# Patient Record
Sex: Male | Born: 1945 | Race: White | Hispanic: No | Marital: Single
Health system: Southern US, Community
[De-identification: ages and names within clinical notes are randomized; demographics above are authoritative.]

---

## 2007-01-10 ENCOUNTER — Ambulatory Visit: Payer: Self-pay

## 2009-08-22 ENCOUNTER — Inpatient Hospital Stay: Payer: Self-pay | Admitting: Student

## 2011-04-15 ENCOUNTER — Inpatient Hospital Stay: Payer: Self-pay | Admitting: Internal Medicine

## 2011-04-15 LAB — ETHANOL: Ethanol %: <0.003 % (ref 0.000–0.080)

## 2011-04-15 LAB — COMPREHENSIVE METABOLIC PANEL
Albumin: 3.3 g/dL — ABNORMAL LOW (ref 3.4–5.0)
Alkaline Phosphatase: 65 U/L (ref 50–136)
Anion Gap: 10 (ref 7–16)
BUN: 4 mg/dL — ABNORMAL LOW (ref 7–18)
Bilirubin,Total: 2.1 mg/dL — ABNORMAL HIGH (ref 0.2–1.0)
Calcium, Total: 8.8 mg/dL (ref 8.5–10.1)
Co2: 34 mmol/L — ABNORMAL HIGH (ref 21–32)
EGFR (African American): >60
Glucose: 122 mg/dL — ABNORMAL HIGH (ref 65–99)
Osmolality: 268 (ref 275–301)
SGOT(AST): 46 U/L — ABNORMAL HIGH (ref 15–37)
Sodium: 135 mmol/L — ABNORMAL LOW (ref 136–145)

## 2011-04-15 LAB — CBC
HCT: 41.8 % (ref 40.0–52.0)
MCH: 36.1 pg — ABNORMAL HIGH (ref 26.0–34.0)
MCHC: 34.8 g/dL (ref 32.0–36.0)
MCV: 104 fL — ABNORMAL HIGH (ref 80–100)
Platelet: 91 10*3/uL — ABNORMAL LOW (ref 150–440)
RDW: 13.4 % (ref 11.5–14.5)

## 2011-04-15 LAB — FOLATE: Folic Acid: 43.6 ng/mL (ref 3.1–100.0)

## 2011-04-15 LAB — MAGNESIUM: Magnesium: 0.9 mg/dL — ABNORMAL LOW

## 2011-04-15 LAB — PROTIME-INR
INR: 1.1
Prothrombin Time: 14.6 secs (ref 11.5–14.7)

## 2011-04-16 LAB — COMPREHENSIVE METABOLIC PANEL
Albumin: 3.3 g/dL — ABNORMAL LOW (ref 3.4–5.0)
Alkaline Phosphatase: 61 U/L (ref 50–136)
Anion Gap: 13 (ref 7–16)
BUN: 8 mg/dL (ref 7–18)
Bilirubin,Total: 1.5 mg/dL — ABNORMAL HIGH (ref 0.2–1.0)
Calcium, Total: 8.9 mg/dL (ref 8.5–10.1)
Creatinine: 0.75 mg/dL (ref 0.60–1.30)
Glucose: 142 mg/dL — ABNORMAL HIGH (ref 65–99)
Osmolality: 269 (ref 275–301)
Potassium: 3.7 mmol/L (ref 3.5–5.1)
SGOT(AST): 39 U/L — ABNORMAL HIGH (ref 15–37)
Sodium: 134 mmol/L — ABNORMAL LOW (ref 136–145)
Total Protein: 7.2 g/dL (ref 6.4–8.2)

## 2011-04-16 LAB — CBC WITH DIFFERENTIAL/PLATELET
Basophil #: 0 10*3/uL (ref 0.0–0.1)
Eosinophil #: 0 10*3/uL (ref 0.0–0.7)
HCT: 44.1 % (ref 40.0–52.0)
HGB: 15 g/dL (ref 13.0–18.0)
Lymphocyte #: 0.5 10*3/uL — ABNORMAL LOW (ref 1.0–3.6)
Lymphocyte %: 12.1 %
MCV: 105 fL — ABNORMAL HIGH (ref 80–100)
Monocyte %: 3.6 %
Platelet: 87 10*3/uL — ABNORMAL LOW (ref 150–440)
RBC: 4.21 10*6/uL — ABNORMAL LOW (ref 4.40–5.90)
RDW: 14.3 % (ref 11.5–14.5)
WBC: 3.9 10*3/uL (ref 3.8–10.6)

## 2011-04-18 LAB — EXPECTORATED SPUTUM ASSESSMENT W GRAM STAIN, RFLX TO RESP C

## 2011-06-06 ENCOUNTER — Inpatient Hospital Stay: Payer: Self-pay | Admitting: Internal Medicine

## 2011-06-06 LAB — COMPREHENSIVE METABOLIC PANEL
Albumin: 4.2 g/dL (ref 3.4–5.0)
Anion Gap: 14 (ref 7–16)
Calcium, Total: 9.5 mg/dL (ref 8.5–10.1)
Chloride: 92 mmol/L — ABNORMAL LOW (ref 98–107)
Co2: 30 mmol/L (ref 21–32)
EGFR (African American): >60
Glucose: 102 mg/dL — ABNORMAL HIGH (ref 65–99)
Osmolality: 270 (ref 275–301)
Potassium: 3.7 mmol/L (ref 3.5–5.1)
SGOT(AST): 48 U/L — ABNORMAL HIGH (ref 15–37)
SGPT (ALT): 26 U/L
Sodium: 136 mmol/L (ref 136–145)

## 2011-06-06 LAB — PROTIME-INR: INR: 1.2

## 2011-06-06 LAB — CBC
HCT: 44.2 % (ref 40.0–52.0)
MCH: 35.8 pg — ABNORMAL HIGH (ref 26.0–34.0)
Platelet: 100 10*3/uL — ABNORMAL LOW (ref 150–440)
RDW: 14.9 % — ABNORMAL HIGH (ref 11.5–14.5)
WBC: 4.7 10*3/uL (ref 3.8–10.6)

## 2011-06-06 LAB — CK TOTAL AND CKMB (NOT AT ARMC): CK, Total: 44 U/L (ref 35–232)

## 2011-06-07 LAB — CK TOTAL AND CKMB (NOT AT ARMC)
CK, Total: 34 U/L — ABNORMAL LOW (ref 35–232)
CK, Total: 31 U/L — ABNORMAL LOW (ref 35–232)
CK-MB: 1.2 ng/mL (ref 0.5–3.6)
CK-MB: 1.1 ng/mL (ref 0.5–3.6)

## 2011-06-07 LAB — COMPREHENSIVE METABOLIC PANEL
Anion Gap: 15 (ref 7–16)
BUN: 9 mg/dL (ref 7–18)
Bilirubin,Total: 1.8 mg/dL — ABNORMAL HIGH (ref 0.2–1.0)
Calcium, Total: 9 mg/dL (ref 8.5–10.1)
Chloride: 92 mmol/L — ABNORMAL LOW (ref 98–107)
Co2: 27 mmol/L (ref 21–32)
Creatinine: 0.75 mg/dL (ref 0.60–1.30)
EGFR (African American): >60
EGFR (Non-African Amer.): >60
SGPT (ALT): 22 U/L
Total Protein: 7.4 g/dL (ref 6.4–8.2)

## 2011-06-07 LAB — CBC WITH DIFFERENTIAL/PLATELET
Basophil %: 0.5 %
Eosinophil #: 0 10*3/uL (ref 0.0–0.7)
Eosinophil %: 0.3 %
HCT: 42.4 % (ref 40.0–52.0)
HGB: 14.6 g/dL (ref 13.0–18.0)
Lymphocyte #: 0.7 10*3/uL — ABNORMAL LOW (ref 1.0–3.6)
Lymphocyte %: 33 %
MCHC: 34.4 g/dL (ref 32.0–36.0)
Monocyte #: 0 10*3/uL (ref 0.0–0.7)
Monocyte %: 1.4 %
Neutrophil #: 1.3 10*3/uL — ABNORMAL LOW (ref 1.4–6.5)
Neutrophil %: 64.8 %
RBC: 4.07 10*6/uL — ABNORMAL LOW (ref 4.40–5.90)
WBC: 2.1 10*3/uL — ABNORMAL LOW (ref 3.8–10.6)

## 2011-06-07 LAB — TROPONIN I: Troponin-I: <0.02 ng/mL

## 2011-06-08 LAB — CBC WITH DIFFERENTIAL/PLATELET
Basophil %: 0.1 %
Eosinophil #: 0 10*3/uL (ref 0.0–0.7)
HGB: 13.9 g/dL (ref 13.0–18.0)
Lymphocyte %: 8.8 %
MCH: 35.6 pg — ABNORMAL HIGH (ref 26.0–34.0)
MCHC: 34 g/dL (ref 32.0–36.0)
MCV: 105 fL — ABNORMAL HIGH (ref 80–100)
Monocyte #: 0.2 10*3/uL (ref 0.0–0.7)
Neutrophil #: 4.6 10*3/uL (ref 1.4–6.5)
Platelet: 88 10*3/uL — ABNORMAL LOW (ref 150–440)
RDW: 14.5 % (ref 11.5–14.5)

## 2011-06-08 LAB — URINALYSIS, COMPLETE
Bacteria: NONE SEEN
Bilirubin,UR: NEGATIVE
Blood: NEGATIVE
Glucose,UR: NEGATIVE mg/dL (ref 0–75)
Specific Gravity: 1.018 (ref 1.003–1.030)
Squamous Epithelial: <1

## 2011-06-10 LAB — URINE CULTURE

## 2013-10-03 ENCOUNTER — Inpatient Hospital Stay: Payer: Self-pay | Admitting: Internal Medicine

## 2013-10-03 LAB — CBC
HCT: 36.1 % — AB (ref 40.0–52.0)
HGB: 12.2 g/dL — ABNORMAL LOW (ref 13.0–18.0)
MCH: 34.5 pg — AB (ref 26.0–34.0)
MCHC: 33.8 g/dL (ref 32.0–36.0)
MCV: 102 fL — ABNORMAL HIGH (ref 80–100)
PLATELETS: 193 10*3/uL (ref 150–440)
RBC: 3.54 10*6/uL — ABNORMAL LOW (ref 4.40–5.90)
RDW: 14.1 % (ref 11.5–14.5)
WBC: 9.1 10*3/uL (ref 3.8–10.6)

## 2013-10-03 LAB — COMPREHENSIVE METABOLIC PANEL
ALBUMIN: 2.4 g/dL — AB (ref 3.4–5.0)
Alkaline Phosphatase: 106 U/L
Anion Gap: 8 (ref 7–16)
BILIRUBIN TOTAL: 1.7 mg/dL — AB (ref 0.2–1.0)
BUN: 8 mg/dL (ref 7–18)
CHLORIDE: 95 mmol/L — AB (ref 98–107)
CREATININE: 0.71 mg/dL (ref 0.60–1.30)
Calcium, Total: 8.4 mg/dL — ABNORMAL LOW (ref 8.5–10.1)
Co2: 29 mmol/L (ref 21–32)
EGFR (African American): >60
EGFR (Non-African Amer.): >60
GLUCOSE: 86 mg/dL (ref 65–99)
Osmolality: 262 (ref 275–301)
Potassium: 3.5 mmol/L (ref 3.5–5.1)
SGOT(AST): 74 U/L — ABNORMAL HIGH (ref 15–37)
SGPT (ALT): 33 U/L (ref 12–78)
Sodium: 132 mmol/L — ABNORMAL LOW (ref 136–145)
Total Protein: 6.6 g/dL (ref 6.4–8.2)

## 2013-10-03 LAB — PRO B NATRIURETIC PEPTIDE: B-TYPE NATIURETIC PEPTID: 629 pg/mL — AB (ref 0–125)

## 2013-10-03 LAB — CK TOTAL AND CKMB (NOT AT ARMC)
CK, Total: 204 U/L
CK-MB: 1.9 ng/mL (ref 0.5–3.6)

## 2013-10-03 LAB — D-DIMER(ARMC): D-DIMER: 5463 ng/mL

## 2013-10-03 LAB — TROPONIN I: Troponin-I: <0.02 ng/mL

## 2013-10-04 LAB — BASIC METABOLIC PANEL
ANION GAP: 10 (ref 7–16)
BUN: 10 mg/dL (ref 7–18)
CALCIUM: 8.2 mg/dL — AB (ref 8.5–10.1)
CHLORIDE: 96 mmol/L — AB (ref 98–107)
CO2: 26 mmol/L (ref 21–32)
Creatinine: 0.7 mg/dL (ref 0.60–1.30)
Glucose: 118 mg/dL — ABNORMAL HIGH (ref 65–99)
Osmolality: 265 (ref 275–301)
POTASSIUM: 4.2 mmol/L (ref 3.5–5.1)
Sodium: 132 mmol/L — ABNORMAL LOW (ref 136–145)

## 2013-10-04 LAB — CBC WITH DIFFERENTIAL/PLATELET
BASOS ABS: 0 10*3/uL (ref 0.0–0.1)
Basophil %: 0.3 %
EOS ABS: 0 10*3/uL (ref 0.0–0.7)
Eosinophil %: 0 %
HCT: 34.5 % — ABNORMAL LOW (ref 40.0–52.0)
HGB: 11.6 g/dL — ABNORMAL LOW (ref 13.0–18.0)
LYMPHS ABS: 0.3 10*3/uL — AB (ref 1.0–3.6)
LYMPHS PCT: 5.3 %
MCH: 34.5 pg — ABNORMAL HIGH (ref 26.0–34.0)
MCHC: 33.8 g/dL (ref 32.0–36.0)
MCV: 102 fL — ABNORMAL HIGH (ref 80–100)
MONOS PCT: 3.4 %
Monocyte #: 0.2 x10 3/mm (ref 0.2–1.0)
NEUTROS ABS: 5.5 10*3/uL (ref 1.4–6.5)
NEUTROS PCT: 91 %
PLATELETS: 146 10*3/uL — AB (ref 150–440)
RBC: 3.38 10*6/uL — ABNORMAL LOW (ref 4.40–5.90)
RDW: 14 % (ref 11.5–14.5)
WBC: 6 10*3/uL (ref 3.8–10.6)

## 2013-10-04 LAB — HEMOGLOBIN
HGB: 11.1 g/dL — ABNORMAL LOW (ref 13.0–18.0)
HGB: 12.3 g/dL — AB (ref 13.0–18.0)

## 2013-10-04 LAB — MAGNESIUM: Magnesium: 1.2 mg/dL — ABNORMAL LOW

## 2013-10-04 LAB — OCCULT BLOOD X 1 CARD TO LAB, STOOL: Occult Blood, Feces: POSITIVE

## 2013-10-05 LAB — BASIC METABOLIC PANEL
Anion Gap: 5 — ABNORMAL LOW (ref 7–16)
BUN: 11 mg/dL (ref 7–18)
CALCIUM: 8.1 mg/dL — AB (ref 8.5–10.1)
CHLORIDE: 97 mmol/L — AB (ref 98–107)
CO2: 30 mmol/L (ref 21–32)
Creatinine: 0.88 mg/dL (ref 0.60–1.30)
GLUCOSE: 118 mg/dL — AB (ref 65–99)
OSMOLALITY: 265 (ref 275–301)
Potassium: 4.7 mmol/L (ref 3.5–5.1)
SODIUM: 132 mmol/L — AB (ref 136–145)

## 2013-10-05 LAB — CBC WITH DIFFERENTIAL/PLATELET
BASOS PCT: 0.8 %
Basophil #: 0 10*3/uL (ref 0.0–0.1)
EOS ABS: 0.1 10*3/uL (ref 0.0–0.7)
Eosinophil %: 2.8 %
HCT: 32.9 % — AB (ref 40.0–52.0)
HGB: 11.3 g/dL — ABNORMAL LOW (ref 13.0–18.0)
LYMPHS PCT: 15.1 %
Lymphocyte #: 0.7 10*3/uL — ABNORMAL LOW (ref 1.0–3.6)
MCH: 34.9 pg — AB (ref 26.0–34.0)
MCHC: 34.2 g/dL (ref 32.0–36.0)
MCV: 102 fL — ABNORMAL HIGH (ref 80–100)
MONO ABS: 0.3 x10 3/mm (ref 0.2–1.0)
Monocyte %: 8 %
Neutrophil #: 3.2 10*3/uL (ref 1.4–6.5)
Neutrophil %: 73.3 %
Platelet: 139 10*3/uL — ABNORMAL LOW (ref 150–440)
RBC: 3.22 10*6/uL — ABNORMAL LOW (ref 4.40–5.90)
RDW: 13.9 % (ref 11.5–14.5)
WBC: 4.3 10*3/uL (ref 3.8–10.6)

## 2013-10-05 LAB — VANCOMYCIN, TROUGH: Vancomycin, Trough: 20 ug/mL (ref 10–20)

## 2013-10-05 LAB — HEMOGLOBIN: HGB: 11.8 g/dL — ABNORMAL LOW (ref 13.0–18.0)

## 2013-10-06 LAB — HEMOGLOBIN: HGB: 10.7 g/dL — ABNORMAL LOW (ref 13.0–18.0)

## 2013-10-06 LAB — CREATININE, SERUM
Creatinine: 0.9 mg/dL (ref 0.60–1.30)
EGFR (African American): >60

## 2013-10-06 LAB — EXPECTORATED SPUTUM ASSESSMENT W REFEX TO RESP CULTURE

## 2013-10-07 LAB — BUN: BUN: 5 mg/dL — ABNORMAL LOW (ref 7–18)

## 2013-10-07 LAB — HEMOGLOBIN: HGB: 11.5 g/dL — ABNORMAL LOW (ref 13.0–18.0)

## 2013-10-08 LAB — CULTURE, BLOOD (SINGLE)

## 2014-03-17 LAB — COMPREHENSIVE METABOLIC PANEL
Albumin: 4 g/dL (ref 3.4–5.0)
Alkaline Phosphatase: 60 U/L
Anion Gap: 5 — ABNORMAL LOW (ref 7–16)
BUN: 9 mg/dL (ref 7–18)
Bilirubin,Total: 2 mg/dL — ABNORMAL HIGH (ref 0.2–1.0)
Calcium, Total: 9.6 mg/dL (ref 8.5–10.1)
Chloride: 96 mmol/L — ABNORMAL LOW (ref 98–107)
Co2: 31 mmol/L (ref 21–32)
Creatinine: 0.95 mg/dL (ref 0.60–1.30)
EGFR (Non-African Amer.): >60
GLUCOSE: 111 mg/dL — AB (ref 65–99)
Osmolality: 264 (ref 275–301)
Potassium: 4.5 mmol/L (ref 3.5–5.1)
SGOT(AST): 33 U/L (ref 15–37)
SGPT (ALT): 22 U/L
Sodium: 132 mmol/L — ABNORMAL LOW (ref 136–145)
Total Protein: 7.6 g/dL (ref 6.4–8.2)

## 2014-03-17 LAB — CBC
HCT: 43.5 % (ref 40.0–52.0)
HGB: 15 g/dL (ref 13.0–18.0)
MCH: 36.2 pg — AB (ref 26.0–34.0)
MCHC: 34.6 g/dL (ref 32.0–36.0)
MCV: 105 fL — AB (ref 80–100)
Platelet: 108 10*3/uL — ABNORMAL LOW (ref 150–440)
RBC: 4.15 10*6/uL — ABNORMAL LOW (ref 4.40–5.90)
RDW: 14.7 % — ABNORMAL HIGH (ref 11.5–14.5)
WBC: 9.5 10*3/uL (ref 3.8–10.6)

## 2014-03-17 LAB — TROPONIN I

## 2014-03-17 LAB — CK TOTAL AND CKMB (NOT AT ARMC)
CK, TOTAL: 50 U/L (ref 39–308)
CK-MB: 1.2 ng/mL (ref 0.5–3.6)

## 2014-03-18 ENCOUNTER — Inpatient Hospital Stay: Payer: Self-pay | Admitting: Internal Medicine

## 2014-03-18 LAB — CBC WITH DIFFERENTIAL/PLATELET
BASOS ABS: 0 10*3/uL (ref 0.0–0.1)
Basophil %: 0.2 %
EOS ABS: 0 10*3/uL (ref 0.0–0.7)
Eosinophil %: 0.3 %
HCT: 36.8 % — ABNORMAL LOW (ref 40.0–52.0)
HGB: 12.9 g/dL — AB (ref 13.0–18.0)
LYMPHS PCT: 14.9 %
Lymphocyte #: 0.4 10*3/uL — ABNORMAL LOW (ref 1.0–3.6)
MCH: 37 pg — AB (ref 26.0–34.0)
MCHC: 35.2 g/dL (ref 32.0–36.0)
MCV: 105 fL — AB (ref 80–100)
MONOS PCT: 1.1 %
Monocyte #: 0 x10 3/mm — ABNORMAL LOW (ref 0.2–1.0)
NEUTROS ABS: 2 10*3/uL (ref 1.4–6.5)
Neutrophil %: 83.5 %
Platelet: 78 10*3/uL — ABNORMAL LOW (ref 150–440)
RBC: 3.49 10*6/uL — ABNORMAL LOW (ref 4.40–5.90)
RDW: 14.8 % — AB (ref 11.5–14.5)
WBC: 2.4 10*3/uL — ABNORMAL LOW (ref 3.8–10.6)

## 2014-03-18 LAB — BASIC METABOLIC PANEL
Anion Gap: 8 (ref 7–16)
BUN: 13 mg/dL (ref 7–18)
CHLORIDE: 98 mmol/L (ref 98–107)
Calcium, Total: 9.1 mg/dL (ref 8.5–10.1)
Co2: 28 mmol/L (ref 21–32)
Creatinine: 0.91 mg/dL (ref 0.60–1.30)
EGFR (African American): >60
EGFR (Non-African Amer.): >60
GLUCOSE: 177 mg/dL — AB (ref 65–99)
Osmolality: 273 (ref 275–301)
Potassium: 4.5 mmol/L (ref 3.5–5.1)
Sodium: 134 mmol/L — ABNORMAL LOW (ref 136–145)

## 2014-03-19 LAB — BASIC METABOLIC PANEL
ANION GAP: 3 — AB (ref 7–16)
BUN: 19 mg/dL — AB (ref 7–18)
CREATININE: 0.77 mg/dL (ref 0.60–1.30)
Calcium, Total: 8.2 mg/dL — ABNORMAL LOW (ref 8.5–10.1)
Chloride: 104 mmol/L (ref 98–107)
Co2: 28 mmol/L (ref 21–32)
EGFR (Non-African Amer.): >60
GLUCOSE: 146 mg/dL — AB (ref 65–99)
Osmolality: 275 (ref 275–301)
POTASSIUM: 4.4 mmol/L (ref 3.5–5.1)
Sodium: 135 mmol/L — ABNORMAL LOW (ref 136–145)

## 2014-03-19 LAB — CBC WITH DIFFERENTIAL/PLATELET
BASOS ABS: 0 10*3/uL (ref 0.0–0.1)
BASOS PCT: 0.1 %
EOS ABS: 0 10*3/uL (ref 0.0–0.7)
Eosinophil %: 0 %
HCT: 34.7 % — ABNORMAL LOW (ref 40.0–52.0)
HGB: 12.1 g/dL — ABNORMAL LOW (ref 13.0–18.0)
Lymphocyte #: 0.3 10*3/uL — ABNORMAL LOW (ref 1.0–3.6)
Lymphocyte %: 3.9 %
MCH: 36.8 pg — ABNORMAL HIGH (ref 26.0–34.0)
MCHC: 34.8 g/dL (ref 32.0–36.0)
MCV: 106 fL — ABNORMAL HIGH (ref 80–100)
MONO ABS: 0.2 x10 3/mm (ref 0.2–1.0)
MONOS PCT: 2.5 %
Neutrophil #: 7.5 10*3/uL — ABNORMAL HIGH (ref 1.4–6.5)
Neutrophil %: 93.5 %
Platelet: 74 10*3/uL — ABNORMAL LOW (ref 150–440)
RBC: 3.29 10*6/uL — ABNORMAL LOW (ref 4.40–5.90)
RDW: 14.5 % (ref 11.5–14.5)
WBC: 8 10*3/uL (ref 3.8–10.6)

## 2014-05-19 DEATH — deceased

## 2014-07-11 NOTE — Consult Note (Signed)
Chief Complaint:  Subjective/Chief Complaint seen for possible GI bleeding.  no further emesis, no abdominalpain.  bm brown.  tolerating regular diet po.   VITAL SIGNS/ANCILLARY NOTES: **Vital Signs.:   20-Jul-15 14:20  Vital Signs Type Routine  Temperature Temperature (F) 98.3  Celsius 36.8  Pulse Pulse 80  Respirations Respirations 20  Systolic BP Systolic BP 295  Diastolic BP (mmHg) Diastolic BP (mmHg) 61  Mean BP 75  Pulse Ox % Pulse Ox % 96  Pulse Ox Activity Level  At rest  Oxygen Delivery Room Air/ 21 %   Brief Assessment:  Cardiac Regular   Respiratory clear BS   Gastrointestinal details normal Nontender  protuberant, positive ascites, bs positive,   Lab Results: Routine Chem:  20-Jul-15 06:53   Creatinine (comp) 0.90  eGFR (African American) >60  eGFR (Non-African American) >60 (eGFR values <1m/min/1.73 m2 may be an indication of chronic kidney disease (CKD). Calculated eGFR is useful in patients with stable renal function. The eGFR calculation will not be reliable in acutely ill patients when serum creatinine is changing rapidly. It is not useful in  patients on dialysis. The eGFR calculation may not be applicable to patients at the low and high extremes of body sizes, pregnant women, and vegetarians.)  Routine Hem:  17-Jul-15 14:47   Hemoglobin (CBC)  12.2  18-Jul-15 05:57   Hemoglobin (CBC)  11.6    09:58   Hemoglobin (CBC)  11.1 (Result(s) reported on 04 Oct 2013 at 10:15AM.)    14:46   Hemoglobin (CBC)  12.3 (Result(s) reported on 04 Oct 2013 at 03:03PM.)  19-Jul-15 08:05   Hemoglobin (CBC)  11.3    17:37   Hemoglobin (CBC)  11.8 (Result(s) reported on 05 Oct 2013 at 06:20PM.)  20-Jul-15 06:53   Hemoglobin (CBC)  10.7 (Result(s) reported on 06 Oct 2013 at 07:16AM.)   Assessment/Plan:  Assessment/Plan:  Assessment 1) emesis, coffeeground-not recurrent over 48 hours.  hemodynamically stable.  likelly related to small amounts of blood  swallowed.from oropharyngeal surgical sites.  2) passed swallowing study, now on soft diet, tolerating.  3) cirrhosis, ongoing etoh abuse.   Plan 1) continue daily ppi, GI o/p fu with primary md.  I will happy to see if needed/   Electronic Signatures: SLoistine Simas(MD)  (Signed 20-Jul-15 18:15)  Authored: Chief Complaint, VITAL SIGNS/ANCILLARY NOTES, Brief Assessment, Lab Results, Assessment/Plan   Last Updated: 20-Jul-15 18:15 by SLoistine Simas(MD)

## 2014-07-11 NOTE — H&P (Signed)
PATIENT NAME:  Randa SpikeRIGGS, Kaiea MR#:  [id number] DATE OF BIRTH:  [birth year]  DATE OF ADMISSION:  10/03/2013  PRIMARY CARE PHYSICIAN: [facility]   CHIEF COMPLAINT: Shortness of breath.   HISTORY OF PRESENT ILLNESS: This is a 69 year old man. History is difficult to obtain from him secondary to him not having his hearing aids in.  He just states that he feels bad and has breathing problem. Feels terrible. He did give me the phone number of Alesia MorinStephanie Sprinkle, [phone].  As per her, he [facility] for a tongue cancer, roof of the mouth and lymph node dissection in the neck.  He was on the ventilator for 2 days.  Yesterday, he was discharged home. Refused to go to rehabilitation.  He got home last night at 5: 30 p.m. His pulse oximetry this morning was 87%, and in the afternoon, 80%. He is complaining of shortness of breath. In the ER, he was found to have pneumonia and acute respiratory failure with a low pulse oximetry. Hospitalist services were contacted for further evaluation. Again, history from patient difficult to obtain secondary to hearing loss.   PAST MEDICAL HISTORY: Chronic obstructive pulmonary disease, cirrhosis of the liver, alcohol abuse, and depression.   PAST SURGICAL HISTORY: Neck surgery.  Still has staples in.   ALLERGIES: NO KNOWN DRUG ALLERGIES.   MEDICATIONS: As per prescription writer include DuoNeb nebulizer solution every 4 hours, Celexa 20 mg daily, ferrous sulfate 325 mg twice a day, folic acid 1 mg daily, furosemide 20 mg half tablet daily, magnesium oxide 400 mg twice a day, multivitamin with minerals daily, Naprosyn 500 mg twice a day, omeprazole 20 mg twice a day, propranolol 5 mg daily, Proventil 2 puffs every 6 hours, Spiriva 1 inhalation daily, Bactrim DS 1 tablet twice a day, theophylline SR 300 mg daily, and thiamine 100 mg daily.   SOCIAL HISTORY: He drinks alcohol, but has not drank last night, had 2 beers; but prior to the surgery and in the  hospital, was not drinking.  Smokes 2-3 packs per day. No drug use. Lives with a friend.   FAMILY HISTORY: Unable to obtain secondary to difficulty history.    REVIEW OF SYSTEMS: Unable to obtain secondary to hearing loss.   PHYSICAL EXAMINATION:  VITAL SIGNS: Temperature 99, pulse 98, respirations 24, blood pressure 146/71, pulse oximetry 92% on oxygen. When he presented to the ER, his pulse oximetry is 95% on 3 liters.  EYES: Conjunctivae and lids normal. Pupils equal, round, and reactive to light. Unable to test extraocular muscles.  EARS, NOSE, MOUTH AND THROAT: Status post surgery on partial tongue and back of the throat.  Some food residual in the back of the mouth.  NECK: No JVD. No bruits. No lymphadenopathy. Staples still in the left neck.  CARDIOVASCULAR: S1, S2 normal. No gallops, rubs, or murmurs heard. Carotid upstroke 2+ bilaterally. No bruits. Dorsalis pedis pulses 1+ bilaterally. Trace edema of the lower extremity.  LUNGS: Coarse breath sounds throughout. No wheeze or rhonchi heard.  ABDOMEN: Soft and nontender. No organomegaly/splenomegaly. Normoactive bowel sounds.  Umbilical hernia.  EXTREMITIES: Trace edema. No clubbing. No cyanosis.  SKIN: No ulcers or lesions.  NEUROLOGIC: Cranial nerves II-XII grossly intact.  Patient difficult to follow instructions secondary to the hearing loss though.  Moving all extremities on his own. Reflexes 1+ bilateral lower extremity.  PSYCHIATRIC: Difficult to test secondary to hearing loss.   LABORATORY AND RADIOLOGICAL DATA: D-dimer 5463. Troponin negative. Glucose  86, BUN 8, creatinine 0.71, sodium 132, potassium 3.5, chloride 95, CO2 28, calcium 8.4, total bilirubin 1.7, alkaline phosphatase 106.  ALT 33, AST 74. White blood cell count 7.9, hemoglobin and hematocrit 12.2 and 36.1, platelet count 193,000. BNP 627.   Chest x-ray showed right lower lobe pneumonia. Venous pH 7.47. CT scan of the chest showed no evidence of pulmonary embolism,  multifocal peribronchial nodular and some solid opacity in the right middle and right upper lobe, could be infectious, but could be intermediate for malignancy. Cirrhosis seen.   ASSESSMENT AND PLAN:  1.  Acute respiratory failure. Pulse oximetry low on room air, in the 80s, requiring oxygen supplementation to maintain adequate saturations.   2.  Likely aspiration pneumonia with recent tongue and throat surgery. We will get aggressive with antibiotics since he was recently in the hospital with vancomycin, Zosyn and Levaquin, and continue to monitor.   3.  Chronic obstructive pulmonary disease.  I am going to hold off on steroids at this point. Continue inhalers.   4.  Cirrhosis on low-dose propranolol.   5.  Alcohol abuse. No need for CIWA protocol at this point since he was recently in the hospital.   6.  Depression. Continue Celexa.   7.  Hypokalemia: Will replace that IV.   8.  I will get a swallow evaluation with a recent surgery to see how well he swallows.  Put on dysphagia diet at this point in time.  TIME SPENT ON ADMISSION: 55 minutes.   CODE STATUS: The patient is a DO NOT RESUSCITATE.     ____________________________ Herschell Dimes. Renae Gloss, MD rjw:ts D: 10/03/2013 18:56:34 ET T: 10/03/2013 19:37:30 ET JOB#: [id number]  cc: Herschell Dimes. Renae Gloss, MD, <Dictator> [facility] Jeanella Anton MD ELECTRONICALLY SIGNED 10/03/2013 21:40

## 2014-07-11 NOTE — Consult Note (Signed)
Chief Complaint:  Subjective/Chief Complaint seen for hematemesis, , melena. no repeat bm or emesis.  no abdominalp pain, main c/o is wants to eat.   VITAL SIGNS/ANCILLARY NOTES: **Vital Signs.:   19-Jul-15 13:29  Vital Signs Type Q 4hr  Temperature Temperature (F) 98.2  Celsius 36.7  Temperature Source oral  Pulse Pulse 88  Respirations Respirations 20  Systolic BP Systolic BP 117  Diastolic BP (mmHg) Diastolic BP (mmHg) 65  Mean BP 82  Pulse Ox % Pulse Ox % 92  Pulse Ox Activity Level  At rest  Oxygen Delivery 2L; Nasal Cannula  *Intake and Output.:   Daily 19-Jul-15 07:00  Emesis ml     Out:  240   Brief Assessment:  Cardiac Regular   Respiratory coarse airway noise.   Gastrointestinal details normal Soft  Nontender  Bowel sounds normal  protuberant/ ascites   Lab Results: Routine Chem:  17-Jul-15 14:47   BUN 8  18-Jul-15 05:57   BUN 10  19-Jul-15 08:05   BUN 11  Routine Hem:  17-Jul-15 14:47   Hemoglobin (CBC)  12.2  18-Jul-15 05:57   Hemoglobin (CBC)  11.6    09:58   Hemoglobin (CBC)  11.1 (Result(s) reported on 04 Oct 2013 at 10:15AM.)    14:46   Hemoglobin (CBC)  12.3 (Result(s) reported on 04 Oct 2013 at 03:03PM.)  19-Jul-15 08:05   Hemoglobin (CBC)  11.3   Assessment/Plan:  Assessment/Plan:  Assessment 1) coffeeground emesis, heme positiv stool.  no recurrent emesis or bm, no evidence of ongoing bleeding.  likely from recent extensive oropharyngeal surgery [facility].  EGD could be problematic if needed in light of having to pass scope beyond multiple recent oropharyngeal surgical wounds.  2) history of cirrhosis in the setting of ongoing etoh abuse.   Plan continue ppi, agree with d/c octreotide.  Swallowing evaluation tomorrow, to start diet thereafter.   Electronic Signatures: Barnetta ChapelSkulskie, Keelyn Fjelstad (MD)  (Signed 19-Jul-15 15:54)  Authored: Chief Complaint, VITAL SIGNS/ANCILLARY NOTES, Brief Assessment, Lab Results,  Assessment/Plan   Last Updated: 19-Jul-15 15:54 by Barnetta ChapelSkulskie, Harly Pipkins (MD)

## 2014-07-11 NOTE — Consult Note (Signed)
PATIENT NAME:  Gary Cortez, NILE MR#:  [id number] DATE OF BIRTH:  12/08/1945  DATE OF CONSULTATION:  10/04/2013  REFERRING PHYSICIAN:   CONSULTING PHYSICIAN:  Christena Deem, MD  A patient of Dr. Elpidio Anis.   REASON FOR CONSULTATION: Hematemesis in the setting of known history of cirrhosis.   HISTORY OF PRESENT ILLNESS: [name] is a 69 year old Caucasian male, who was admitted to the hospital at [facility] yesterday after a recent discharge from the [place] in, apparently, [place]. He apparently underwent a resection of a tongue cancer on 09/23/2013. He stated he felt he was not getting good care and then went apparently AMA. He was admitted here at [facility] with shortness of breath and pneumonia with acute respiratory failure. Earlier today, he had an episode of a small amount of coffee-ground emesis, as well as passing a dark stool. He has been hemodynamically stable. He states that before his surgery, he had no problems with nausea or vomiting. He had no blood in his stool or black bowel movements. Currently, he states he has some minimal nausea, emesis x 1 today. He usually has a bowel movement daily. He does have problems with heartburn occasionally. He takes no medications for this. There is no dysphagia. He denies any abdominal pain. His abdomen is distended with ascites. He states that he has known he had cirrhosis for many years. He has had an EGD and colonoscopy in the past at the [facility]. He denies any previous history of peptic ulcer disease. He denies any previous GI bleeding.   LIVER HISTORY: The patient continues to use alcohol, drinking about twelve 12-ounce beers daily. He has done this for many years. He does have many tattoos, the first placed about 50 years ago. The last one about 2 years ago. He does have a history of military experience, being in the [employer] for about 4-1/2 years, serving in [place] between the years of 1965 and 1966. No foreign travel otherwise. He has never used IV drugs  or intranasal cocaine. He states he has never had a blood transfusion. There is no family history of liver disease. He denies any previous history of hepatitis or episodes of jaundice.   PAST MEDICAL HISTORY:  1. COPD. 2. Cirrhosis of the liver.  3. Ongoing alcohol abuse.  4. Depression.  5. Recent diagnosis of head and neck cancer with surgery a little over a week ago with neck dissection, as well as debridement of the soft palate and tongue.   ALLERGIES: There are no known drug allergies.   OUTPATIENT MEDICATIONS: Include albuterol/ipratropium, citalopram 40 mg 1/2 tablet daily, ferrous sulfate 325 mg twice a day, folic acid 1 mg once a day, Lasix 20 mg 1/2 tablet daily, magnesium oxide 40 mg tablet twice a day, multiple vitamin with minerals 1 tablet once a day, naproxen 500 mg twice a day, omeprazole 20 mg twice a day, propranolol 10 mg 1/2 tablet once a day, Proventil HFA, Spiriva 18 mcg inhalation capsule once daily, spironolactone 25 mg once a day, Bactrim 1 tablet twice a day, theophylline 300 mg once a day, thiamine 100 mg once a day.   REVIEW OF SYSTEMS: Per admission history and physical, was unable to obtain due to hearing loss and lack of having his hearing aids.   PHYSICAL EXAMINATION:  VITAL SIGNS: Temperature is 98.6, pulse 93, respirations 21, blood pressure 121/65, pulse oximetry 97%.  GENERAL: He is an elderly-appearing Caucasian male in no acute distress. Some difficulty hearing.  HEENT: Normocephalic, atraumatic.  Eyes are anicteric. Oropharynx shows recent debridement at the left side of the tongue extending toward the base. There are bruises below the tongue. There is debridement on the soft palate on the right side. The tongue was left side. He is edentulous.  HEART: Regular rate and rhythm.  LUNGS: Clear.  ABDOMEN: Distended/protuberant. There is some amount of ascites. He is nontender to palpation. There is a small umbilical hernia that is nontender. Bowel sounds are  positive, normoactive.  RECTAL: Anorectal exam deferred.  EXTREMITIES: No clubbing, cyanosis, or edema.  NEUROLOGICAL: Cranial nerves II through XII grossly intact.   LABORATORY DATA: On admission to the hospital on the 17th, he had a BNP of 629, glucose 86, BUN 8, creatinine 0.71, sodium 132, potassium 3.5, chloride 95, bicarbonate 29, calcium 8.4. Hepatic profile shows a total protein of 6.6, albumin 2.4, total bilirubin 1.7, alkaline phosphatase 106, AST 74, ALT 33, CK total was 204, CPK-MB was 1.9. Troponin I x1 less than 0.02. He had a CBC showing a white count of 9.1, hemoglobin and hematocrit of 12.2/36.1, platelet count 193,000. MCV 102.   He has had 3 hemoglobins done today as follows: At 0557, it was 11.6; at 0958, it was 11.1; and at 1446, it was 12.3. He has not received any transfusion. On admission to the hospital, he had a D-dimer of 5463.   He has had blood cultures x 2, no growth. He did have a positive Hemoccult.   He had a CT scan of the chest to rule out PE, yesterday. This showed no evidence of acute pulmonary embolus. There was a multifocal peribronchial nodular and subsolid opacity in the right middle lobe and right upper lobe. Some of these appeared infectious, possibly early pneumonia. Some of them are indeterminate for malignancy, particularly a 15 mm nodule in the upper lobe on the right. There is no mediastinal adenopathy. Small bilateral layering pleural effusions, cirrhosis and stigmata of portal venous hypertension including moderate ascites and varices.   He had a KUB today showing no evidence of bowel obstruction, ascites, and a nonspecific gas and a nondilated small bowel.   ASSESSMENT: Coffee-ground emesis and Hemoccult positive dark stool. The patient does have a history of long-term cirrhosis. He does show esophageal varices on CT scanning. He had 1 episode of coffee-ground emesis earlier today. This has not been repeated. He has been hemodynamically stable and his  serial hemoglobins over the course of the day have been stable, indeed increasing to above baseline on admission. Impression is that although this could indeed be a gastrointestinal bleed, this could also be part of differential diagnosis, including previous bleeding from the surgery sites in the posterior oropharynx, with the small amount of coffee-ground emesis, a clearance of the stomach from that problem. He has no previous history of peptic ulcer disease. He has been taking a proton pump inhibitor at home; however, he also has been taking nonsteroidal anti-inflammatory medications at home, as well. He has been on oral iron at home, which would also cause a dark stool.   RECOMMENDATIONS: The patient is relatively high risk for sedated procedure due to his ongoing pneumonia and multiple other medical issues. He has been hemodynamically stable and his hemoglobin has been stable, as well. Recommend serial hemoglobins, transfuse as needed. Agree with starting the octreotide, as well as proton pump inhibitor drips. If he shows evidence of further bleeding, would place him into the intensive care unit for closer followup. Would consider esophagogastroduodenoscopy, depending on clinical changes.  We will follow with you.    ____________________________ Christena Deem, MD mus:jr D: 10/04/2013 17:20:49 ET T: 10/04/2013 17:59:23 ET JOB#: [id number]  cc: Christena Deem, MD, <Dictator> Christena Deem MD ELECTRONICALLY SIGNED 10/16/2013 17:42 Christena Deem MD ELECTRONICALLY SIGNED 10/16/2013 17:44

## 2014-07-11 NOTE — Discharge Summary (Signed)
PATIENT NAME:  Gary Cortez, Gary Cortez MR#:  [id number] DATE OF BIRTH:  1945-04-21  DATE OF ADMISSION:  10/03/2013 DATE OF DISCHARGE:  10/07/2013  DISCHARGE DIAGNOSES:  1.  Acute respiratory failure.  2.  Aspiration pneumonia.  3.  Hematemesis, likely from bleeding from his surgical site.  4.  Anemia of chronic disease.  5.  Cirrhosis.  6.  Tongue cancer.  7.  Alcohol abuse.  8.  Malnutrition.   CONSULTANTS: Dr. Marva PandaSkulskie, Gastroenterology.   IMAGING STUDIES: Include a chest x-ray which showed right-sided infiltrates, both upper and lower lobes.   ADMITTING HISTORY AND PHYSICAL: Please see detailed H and P dictated by Dr. Renae GlossWieting. In brief, the patient is a 69 year old male patient with history of tongue cancer, recent neck lymph node dissection, presented to the hospital complaining of worsening shortness of breath. The patient was found to have oxygen saturation in the 80s on room air, admitted to the hospitalist service for aspiration pneumonia with infiltrates on the right side.   HOSPITAL COURSE:  1.  Aspiration pneumonia with acute respiratory failure. The patient was started on broad-spectrum antibiotics for concern for healthcare acquired pneumonia, also oxygen to keep his oxygen saturation over 92%. With cultures being negative for MRSA vancomycin was stopped initially, later he is being transitioned to Augmentin and Levaquin at time of discharge to finish course of antibiotics. The patient by the time of discharge is off oxygen, ambulated with saturations over 92% on room air. Cultures negative. Patient afebrile. White count has trended down to normal.  2.  Hematemesis, initially thought to be likely GI bleed, but possible reason is from his surgical site from recent neck dissection. The patient was on octreotide/Protonix drip which has been stopped. Hemoglobin has been fairly stable.  3.  [place]. [facility]  4.  Alcohol abuse. The patient counseled to quit alcohol.    DISCHARGE PHYSICAL EXAMINATION: Prior to discharge, the patient's lungs sound clear.  S1, S2 heard without any murmurs. No edema. Abdomen soft, nontender.   DISCHARGE MEDICATIONS:  1.  Propranolol 5 mg oral once a day.  2.  Omeprazole 20 mg oral 2 times a day.  3.  Multivitamin 1 tablet daily.  3.  Folic acid 1 mg daily.  4.  Ferrous sulfate 325 mg 2 times a day.  5.  Spironolactone 25 mg daily.  6.  Thiamine 100 mg daily.  7.  Lasix 10 mg oral once a day.  8.  Proventil HFA 2 inhalations every 6 hours as needed for wheezing.  9.  DuoNeb 3 mL inhaled every 4 hours as needed.  11. Theophylline 300 mg once a day.  12. Citalopram 20 mg daily.  13. Magnesium oxide 400 mg oral 2 times a day.  14. Augmentin 875 mg oral 2 times a day for 6 days.  15. Levaquin 250 mg daily for 6 days.   DISCHARGE INSTRUCTIONS: Low-sodium diet, activity as tolerated. Followup with primary care physician in 1-2 weeks.    ____________________________ Molinda BailiffSrikar R. Casidy Alberta, MD srs:lt D: 10/09/2013 14:16:53 ET T: 10/09/2013 14:41:30 ET JOB#: [id number]  cc: Wardell HeathSrikar R. Kathie Posa, MD, <Dictator> [facility] [place] Wardell HeathSRIKAR [place] Bunnie Lederman MD ELECTRONICALLY SIGNED 10/29/2013 15:44

## 2014-07-11 NOTE — Consult Note (Signed)
Brief Consult Note: Diagnosis: coffeeground emesis, dark stool, heme positive.   Patient was seen by consultant.   Consult note dictated.   Recommend further assessment or treatment.   Comments: Pleae see full GI consutl #[id number]. [facility] and p[facility].  He had a fairly moderate dissection for orophayrngeal cancer to include neck dissection, tongue and soft palate/posterior pharynx debridement.  He has had one episode of emesis earlier today and passage of a dark loose stool heme positive.  GI bleeding source may be suspected particularly in that he takes nsaids at home (he also takes PPI) adn h/o cirrhosis, no previous h/o ulcers, positive varices on ct.  He has passed no bright red blood.  He is stable on serial hgb over the course of the day, also hemodynamically stable.  Part of differential diagnosis includes intermittant bleeding from oropharyngeal surgical sites with swallowed blood and emesis of same.  He takes iron at home that would also cause dark stool with heme positive from swalowed blood as well.   Agree with starting octreotide and iv ppi.  NPO for now. Will need swallowing evaluation proir to starting diet for recommendations.   Continue close observations, transfuse as needed, serial hgb.   EGD as indicated clinically, will follow with you. If there is recurrent evidence of new bleeding, recommend transfer to CCU for management.  Electronic Signatures: Barnetta ChapelSkulskie, Nasira Janusz (MD)  (Signed 18-Jul-15 17:33)  Authored: Brief Consult Note   Last Updated: 18-Jul-15 17:33 by Barnetta ChapelSkulskie, Amarian Botero (MD)

## 2014-07-12 NOTE — H&P (Signed)
PATIENT NAME:  Gary Cortez, Gary Cortez MR#:  [id number] DATE OF BIRTH:  [birth year]  DATE OF ADMISSION:  06/06/2011  PRIMARY CARE PHYSICIAN: [facility]  HISTORY OF PRESENT ILLNESS: Patient is a 69 year old Caucasian male with past medical history significant for history of severe emphysema, history of ongoing tobacco abuse for the past 50 years, history of alcohol abuse presented to the hospital with complaints of shortness of breath. According to patient he started having on and off shortness of breath, worsening shortness of breath over the past five days. He has been coughing with yellowish and greenish phlegm. Denies any fevers or chills. He admits of wheezing as well as shortness of breath. His oxygen saturation in the Emergency Room was 84% and hospitalist services were contacted for admission. He was tried on BiPAP, however, he desaturated as was very anxious.   PAST MEDICAL HISTORY:  1. History of severe emphysema.   2. History of admission in 2011 at [facility] for acute on chronic respiratory failure due to chronic obstructive pulmonary disease exacerbation.  3. History of hearing loss, severe bilateral.  4. 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 5. History of alcohol abuse. 6. History of alcohol withdrawal in the past. 7. History of hypokalemia, hypomagnesemia. 8. History of continued alcohol ingestion. 9. History of alcoholic liver disease with ascites.   10. History of variceal bleeding in the past versus esophageal varices based on history on propranolol prescription.  11. History of alcohol dependence, drinking approximately 8 to 12 cans of beer daily according to medical records however, patient tells me that he drinks approximately 12 to 14 of 12-ounces of beers a day approximately. 12. Smokes as mentioned above.   MEDICATION LIST:  1. Thiamine 100 mg p.o. daily.  2. Iron sulfate 325 mg p.o. daily.  3. Bentyl as needed. 4. Lasix 20 mg p.o.  daily.  5. Multivitamins once daily.  6. Omeprazole 20 mg p.o. twice daily. 7. Albuterol nebulizers as needed. 8. Propranolol 10 mg twice daily. 9. Spironolactone 25 mg p.o. daily.  10. Folic acid 1 mg p.o. daily.   SOCIAL HISTORY: Patient lives at home by himself. Continues to smoke approximately 2 packs a day. Smoked since age of 53. Drinks approximately 12 to 14 of 12-ounce beers a day. No IV drug abuse. He was in [place], spent 14 months, now belongs to [place] or [place].    FAMILY HISTORY: Patient is not very sure about family history according to medical records.  REVIEW OF SYSTEMS: Not available as patient forgot his hearing aids and he is not hearing my questions well.  PHYSICAL EXAMINATION: VITAL SIGNS: On arrival to the hospital patient's vitals: Temperature 97, pulse 74, respiration rate 18, blood pressure 137/68, saturation 84% on room air.   GENERAL: This is a well-nourished Caucasian male lying sideways on the stretcher.   HEENT: Pupils are equal, reactive to light. Extraocular movements are intact. No icterus or conjunctivitis. Has very difficult hearing. No pharyngeal erythema. Mucosa is moist.   NECK: Neck did not reveal any masses. Supple, nontender. Thyroid not enlarged. No adenopathy. No JVD or carotid bruits bilaterally. Full range of motion.   LUNGS: Diminished breath sounds bilaterally and wheezing as well as labored inspirations and increased effort. Patient is in moderate respiratory distress especially whenever he moves around as well as coughs. No dullness to percussion. No rales. Few rhonchi were heard.   CARDIOVASCULAR: S1, S2 appreciated. No murmurs, gallops, rubs noted. Distant. PMI not lateralized. Chest  is nontender to palpation.   EXTREMITIES: 1+ pedal pulses. No lower extremity edema, calf tenderness, or cyanosis noted.   ABDOMEN: Soft, mildly tender, uncomfortable in right upper quadrant palpation site. Patient does have hepatomegaly approximately two  finger widths below costophrenic angle.  RECTAL: Deferred.   MUSCULOSKELETAL: Able to move all extremities. No cyanosis, degenerative joint disease, or kyphosis. Gait is not tested.   SKIN: Skin did not reveal any rashes, lesions, erythema, nodularity, induration. It was warm and dry to palpation. Multiple bruises in upper extremities as well as chest were noted.   NEUROLOGICAL: Cranial nerves grossly intact. Sensory is intact except of hearing. No dysarthria, aphasia. Patient is alert, oriented to time, person, and place, cooperative, however, patient does have severe hearing deficiency, difficult to communicate with him. Memory is somewhat impaired but no significant confusion, agitation, depression noted.   LABORATORY, DIAGNOSTIC, AND RADIOLOGICAL DATA: Basic metabolic panel showed glucose 161102, otherwise unremarkable BMP. Patient's liver enzymes showed total bilirubin 2.0, AST 48, otherwise unremarkable study. Patient's cardiac enzymes, first set, within normal limits. CBC: White blood cell count 4.7, hemoglobin 15.2, platelets 100. Coagulation panel: Pro time 15.2, INR 1.2 and activated PTT 34.5. ABGs were done on 32% FiO2 showed pH 7.42, pCO2 44, pO2 65, saturation 92.8%. This was done on nasal cannula. EKG unfortunately is not done.   Radiologic studies: Chest x-ray, portable, single view, 06/06/2011 showed no acute cardiopulmonary disease.   ASSESSMENT AND PLAN:  1. Acute on chronic respiratory failure. Admit patient to medical floor. Continue oxygen therapy. Patient was already tried on BiPAP, however, deteriorated and is very uncomfortable. Will continue oxygen support to keep patient's pulse oximetry at around 88% to 92%. Will continue DuoNebs, Advair as well as Solu-Medrol and oxygen therapy as mentioned above.  2. Chronic obstructive pulmonary disease exacerbation. Continue DuoNebs, Advair and Solu-Medrol. 3. Acute bronchitis. Get sputum cultures. Start patient on Levaquin.  4. Elevated  transaminases, likely due to alcohol abuse, very similar to prior transaminases in the past. Watch for alcohol withdrawal.  5. Alcohol abuse and history of withdrawal. Start CIWA scale if patient develops any symptoms.  6. Tobacco abuse. This was discussed with patient for at least four minutes. Nicotine replacement will be initiated. Patient is not willing yet to quit.  7. Hyponatremia, in the past, seemed to be resolved on current study. Continue patient on Lasix. 8. History of ascites. Continue Lasix as well as spironolactone.  9. History of esophageal varices. Continue patient on propranolol.   TIME SPENT: 50 minutes.   ____________________________ Katharina Caperima Merly Hinkson, MD rv:cms D: 06/06/2011 23:45:52 ET T: 06/07/2011 08:01:08 ET JOB#: [id number]  cc: Katharina Caperima Avri Paiva, MD, <Dictator> [facility] Hyun Reali MD ELECTRONICALLY SIGNED 06/10/2011 10:52

## 2014-07-12 NOTE — Discharge Summary (Signed)
PATIENT NAME:  Gary Cortez, Jowan MR#:  [id number] DATE OF BIRTH:  03/08/1946  DATE OF ADMISSION:  06/06/2011 DATE OF DISCHARGE:  06/10/2011  DIAGNOSES: 1. Acute on chronic respiratory failure due to acute bronchitis and chronic obstructive pulmonary disease exacerbation.  2. Elevated transaminases.  3. 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 4. Cirrhosis with history of varices.  5. Tobacco abuse. 6. Alcohol abuse. 7. Hyponatremia.  8. Bilateral hearing loss.   DISPOSITION: The patient is being discharged home.   FOLLOWUP: [facility] 1 to 2 weeks after discharge.   DISCHARGE MEDICATIONS:  1. Propranolol 10 mg 0.5 tablet once a day.  2. Omeprazole 20 mg b.i.d. 3. Multivitamin with minerals 1 tablet daily.  4. Folic acid 1 mg daily.  5. Ferrous sulfate 325 mg b.i.d.  6. Spironolactone 25 mg daily.  7. Thiamine 100 mg daily.  8. Lasix 20 mg  daily. 9. Symbicort 2 puffs b.i.d.  10. Proventil HFA 2 puffs q. 6 hours p.r.n.  11. Spiriva 18 mcg inhaled daily.  12. Levaquin 500 mg daily for five days.  13. DuoNebs q. 4 hours p.r.n.  14. Prednisone taper as prescribed.     DIET:  Low sodium.  ACTIVITY: As tolerated.   LABORATORY, DIAGNOSTIC, AND RADIOLOGICAL DATA: Chest x-ray showed no acute abnormalities. Abdominal ultrasound showed gallstones with hepatocellular evidence of hepatocellular disease. Urine culture showed no growth. The patient has chronic thrombocytopenia, platelet count ranging from 100 to 98. Normal white count. Normal hemoglobin and hematocrit. CBC normal other than mildly elevated LFTs and bilirubin. Cardiac enzymes negative. Chronic hyponatremia due to cirrhosis.   HOSPITAL COURSE:  The patient is a 69 year old male with past medical history of severe emphysema, ongoing smoking, alcohol abuse, liver cirrhosis, and history of variceal bleed who presented with cough, shortness of breath, and wheezing. He was  admitted with a diagnosis of acute on chronic respiratory failure due to chronic obstructive pulmonary disease exacerbation. His chest x-ray showed no infiltrate. He was treated with Advair, nebulizer treatments, steroid taper, and Spiriva which resulted in resolution of his presenting symptoms. He was initially on oxygen and currently is on room air. He had elevated transaminases, thrombocytopenia, and macrocytosis likely due to alcohol use and cirrhosis. The rest of his medical problems remained stable during the hospitalization. He was treated with CIWA protocol, however is currently not in withdrawal. He was counseled about smoking and alcohol abuse cessation. The patient is being discharged home in a stable condition.   TIME SPENT: 45 minutes.   ____________________________ Darrick MeigsSangeeta Da Authement, MD sp:bjt D: 06/10/2011 16:01:12 ET T: 06/12/2011 09:14:46 ET JOB#: [id number]  cc: Darrick MeigsSangeeta Jayquon Theiler, MD, <Dictator> [facility] Darrick MeigsSANGEETA Hollee Fate MD ELECTRONICALLY SIGNED 06/12/2011 16:40

## 2014-07-12 NOTE — H&P (Signed)
PATIENT NAME:  Gary Cortez, Gary Cortez MR#:  [id number] DATE OF BIRTH:  08/10/1945  DATE OF ADMISSION:  04/15/2011  [facility]   HISTORY OF PRESENT ILLNESS: The patient is a 69 year old Caucasian male with past medical history significant for history of severe emphysema, history of ongoing tobacco abuse for the past 50 or so years, also history of alcohol abuse, presented to the hospital with complaints of shortness of breath. According to the patient, he has been having shortness of breath for a while; however, approximately a week ago he started having problems with more cough as well as phlegm production. He states that his cough is usually dry, however, sometimes intermittently it would lift up some whitish phlegm. He denies any green or yellow phlegm. He denies any fevers or chills. He admits of wheezing in his lungs as well as shortness of breath. He does not have any oxygen at home except with inhalers. He presented to the hospital. He was noted to be uncomfortable. His oxygen saturation was 93% on room air. Hospitalist Services were contacted for admission.   PAST MEDICAL HISTORY:  1. History of severe emphysema.  2. History of admission in 08/2009 to [facility] for acute on chronic respiratory failure due to chronic obstructive pulmonary disease exacerbation.  3. History of hearing loss, severe bilateral. 4. History of hyponatremia due to liver disease.  5. History of alcohol abuse  as well as remote history of alcohol withdrawal.  6. History of hypokalemia, hypomagnesemia.  7. Continued alcohol ingestion. 8. Alcoholic liver cirrhosis with ascites. 9. Presumed history of variceal bleeding versus esophageal varices based on history of propranolol prescription.  10. History of alcohol dependence, drinking approximately 8 to 12 cans of beer daily according to medical records.   MEDICATIONS: Medication list is not available. In the past, he  was on propranolol, Celexa, Combivent, Advair Diskus as well as spironolactone and Lasix. It is unclear if he has any other medications and what the doses of those medications are, unfortunately.   SOCIAL HISTORY: He lives at home by himself. He continues to smoke approximately 2 packs per day. He has smoked since the age of 69. He drinks approximately 8 to 12 cans of beer daily. No IV drug abuse. He was in [place] and spent 14 months.    FAMILY HISTORY: The patient is not aware about that, is not very sure.   REVIEW OF SYSTEMS: Positive for fatigue and weakness for the past one week, back pains, some weight loss and poor appetite recently, chest pains intermittently.  He had a stress test a long time ago, nothing recently. Intermittent nausea and vomiting as well as diarrhea as well as abdominal pains. CONSTITUTIONAL: He denies any fevers, weight gain. EYES: In regards to eyes, denies any blurry vision, double vision, glaucoma, or cataracts. ENT: Denies any tinnitus, allergies, epistaxis, sinus pain, dentures, difficulty swallowing. RESPIRATORY: Admits of cough as well as wheezing. Denies any hemoptysis. He admits of dyspnea but no painful respirations. CARDIOVASCULAR: Denies any orthopnea, edema, arrhythmias, palpitations or syncope. GASTROINTESTINAL: Denies any hematemesis, rectal bleeding, change in bowel habits. GENITOURINARY: Denies dysuria, hematuria, frequency, or incontinence. ENDOCRINOLOGY: Denies polydipsia, nocturia, thyroid problems, heat or cold intolerance or thirst. HEMATOLOGIC: Denies anemia, easy bruising, bleeding, or swollen glands. SKIN: Denies any acne, rashes, lesions, or change in moles. MUSCULOSKELETAL: Denies arthritis, cramps, swelling, or gout. NEUROLOGIC: Denies numbness, epilepsy, or tremor. PSYCHIATRIC: Denies anxiety, insomnia, or depression.    PHYSICAL EXAMINATION:  VITAL SIGNS: On arrival to the hospital, temperature was 97, pulse 83, respirations 24, blood pressure  117/62, saturation was 93% on room air.   GENERAL: This is a well-developed, well-nourished Caucasian male in no significant distress lying on the stretcher.   HEENT: Pupils are equal and reactive to light.  Extraocular movements are intact. No icterus or conjunctivitis. Very difficult hearing. No pharyngeal erythema. Mucosa is moist. 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   NECK: Neck did not reveal any masses.  Supple, nontender. Thyroid is not enlarged. No adenopathy, no jugular venous distention, no carotid bruits bilaterally.  Full range of motion.   LUNGS: Diminished breath sounds bilaterally, rhonchi as well as wheezing, labored inspirations intermittently, especially whenever he moves around as well as whenever he coughs; and then he has increased effort but no dullness to percussion, and mild respiratory distress.  CARDIOVASCULAR: S1, S2 appreciated. No murmurs, gallops, or rubs noted. PMI not lateralized. Chest is nontender to palpation.   EXTREMITIES: 1+ pedal pulses. No lower extremity edema, calf tenderness, or cyanosis.   ABDOMEN: Soft, tender diffusely intermittently in right as well as left upper quadrant areas, but no rebound or guarding were noted. Hepatosplenomegaly was noted.   RECTAL: Deferred.   MUSCULOSKELETAL: Muscle strength: Able to move all extremities. No cyanosis, degenerative joint disease, or kyphosis. Gait is not tested. The patient looks emaciated and cachectic.   SKIN: Did not reveal any rashes, lesions, erythema, nodularity, or induration. Multiple bruises in upper extremities as well as back.   NEUROLOGICAL: Cranial nerves are grossly intact. Sensory is intact. No dysarthria or aphasia.   PSYCHIATRIC: The patient is alert, oriented to time, person, and place, poorly cooperative. Memory is somewhat impaired but no significant confusion, agitation, or depression noted.   LABORATORY, DIAGNOSTIC AND RADIOLOGICAL DATA:  Basic metabolic panel revealed  sodium 135, which is better since June 2011. At that time it was 130. The patient's glucose level is elevated to 122. BUN and creatinine were 4 and 0.54, potassium is low at 3.4.  Alcohol level less than 0.003%.  Liver enzymes: Total protein 7.0, albumin level 3.3, total bilirubin 2.1, AST elevated at 46.  White blood cell count is low at 3.3, hemoglobin 14.6, platelet count is 91. MCV high at 104. EKG: Normal sinus rhythm at 83 beats per minute, normal axis. Junctional ST depressions according to EKG criteria. No acute ST-T changes were noted.  Chest x-ray, PA and lateral, 04/15/2011: Chronic obstructive pulmonary disease without evidence of acute cardiopulmonary disease, according to the radiologist.   ASSESSMENT AND PLAN:  1. Acute on chronic respiratory failure due to chronic obstructive pulmonary disease exacerbation: Admit the patient to a Medical floor. Start him on steroids IV, inhalation therapy every 4 hours, antibiotics. Get sputum cultures and follow culture results as well as continue oxygen therapy.  2. Acute bronchitis: Antibiotics and sputum cultures as above.  3. Hypokalemia: Supplement IV, check magnesium level.  4. Hyperglycemia: Check hemoglobin A1c.  5. Hypernatremia: Seems to be stable since prior studies, likely liver disease related.  6. Elevated transaminases with history of alcohol abuse: We will watch for alcohol withdrawal. Leukopenia: Follow with therapy.  7. Thrombocytopenia: Likely due to liver disease.  8. Elevated MCV: Check a folic acid as well as B12 levels and supplement as needed.  9. History of tobacco abuse: I discussed with the patient for four minutes. We will initiate nicotine replacement therapy.   TIME SPENT:   One hour.    ____________________________  Katharina Caper, MD rv:cbb D: 04/15/2011 12:01:56 ET T: 04/15/2011 13:04:41 ET JOB#: [id number]  cc: Katharina Caper, MD, <Dictator> [facility] Jovanny Stephanie MD ELECTRONICALLY SIGNED  04/30/2011 20:23

## 2014-07-12 NOTE — Discharge Summary (Signed)
PATIENT NAME:  [name], Gary MR#:  [id number] DATE OF BIRTH:  1945-05-11  DATE OF ADMISSION:  04/15/2011 DATE OF DISCHARGE:  04/16/2011  DISPOSITION: The patient is [facility]. The patient has been accepted by Dr. Wilford Gristsalik.  TRANSFER DIAGNOSES:  1. Acute on chronic respiratory failure. 2. Chronic obstructive pulmonary disease exacerbation.  3. Acute bronchitis. 4. Electrolyte abnormalities, including low potassium, magnesium.  5. Hyperglycemia, hemoglobin A1c is 4.6.  6. Chronic hyponatremia. 7. Elevated liver function tests with thrombocytopenia and marked macrocytosis, likely related to alcohol abuse. 8. Leukopenia.  9. Smoking. 10. Alcohol use.   CODE STATUS: DO NOT RESUSCITATE.   CURRENT MEDICATIONS:  1. Advair 500/50, 1 puff b.i.d.  2. Folic acid 1 mg daily.  3. The patient is on alcohol withdrawal protocol/CIWA protocol and is on p.r.n. Haldol and Ativan. Levaquin 500 mg daily.  4. Solu-Medrol 60 mg IV every 6 hours. 5. Nicotine inhaler every  30 minutes p.r.n.  6. Nicotine patch 21 mg daily.  7. Protonix 40 mg daily.  8. Phenergan 6.25 mg IM every 4 hours p.r.n.  9. Inderal 10 mg every 8 hours.  10. Aldactone 25 mg daily.  11. Thiamine 100 mg daily. 12. DuoNebs every 4 hours while awake.  LABORATORY, DIAGNOSTIC AND RADIOLOGICAL DATA:  Chest x-ray shows chronic obstructive pulmonary disease. No evidence of any infiltrate.  White count ranging from 3.3 to 2.9. Hemoglobin 14.6, platelets 91 to 87 with macrocytosis.  Sodium ranging from 135 to 134, glucose 122, potassium 3.4, magnesium 1.2., AST 46. Hemoglobin A1c 4.6.     HOSPITAL COURSE: The patient is a 69 year old male with past medical of alcohol abuse, smoking, alcoholic liver disease, esophageal varies, chronic obstructive pulmonary disease, presents with shortness of breath. He was admitted with a diagnosis of acute on chronic respiratory failure due to chronic obstructive  pulmonary disease exacerbation and acute bronchitis. He was started on Advair respiratory treatments, empiric antibiotics, and IV steroids. The patient continues to have shortness of breath and has wheezing. He also had electrolyte abnormalities, including hypokalemia and hypomagnesemia, which have been addressed. He had hyperglycemia, possibly reactive. His hemoglobin A1c is 4.6. He has chronic mild hyponatremia possibly related to alcoholic liver disease, which was at a stable level. He also had mildly elevated transaminases, thrombocytopenia, macrocytosis likely related to his alcohol abuse. The patient has been aggressively counseled about his alcohol and smoking. While in the hospital, he was started on CIWA protocol with p.r.n. Haldol and Ativan for alcohol withdrawal.   [facility].   TIME SPENT: 45 minutes.   ____________________________ Darrick MeigsSangeeta Nichalas Coin, MD sp:cbb D: 04/16/2011 12:24:56 ET T: 04/16/2011 12:47:57 ET JOB#: [id number]  cc: Darrick MeigsSangeeta Dyanne Yorks, MD, <Dictator> [facility] Darrick MeigsSANGEETA Obryan Radu MD ELECTRONICALLY SIGNED 04/17/2011 15:11

## 2014-07-15 NOTE — H&P (Signed)
PATIENT NAME:  Gary Cortez, [name]:  [id number] DATE OF BIRTH:  1945/05/23  DATE OF ADMISSION:  03/18/2014  PRIMARY CARE PHYSICIAN: [facility]   REFERRING PHYSICIAN: Mark R. Fanny BienQuale, MD   CHIEF COMPLAINT: Shortness of breath.   HISTORY OF PRESENT ILLNESS: Mr. Gary Cortez is a 69 year old male with history of COPD, hypertension, alcohol abuse, cirrhosis and with concern about lung CA refused any workup is brought to the Emergency Department by the EMS with severe shortness of breath. The patient was unable to speak sentences with poor air entry bilaterally. The patient was placed on BiPAP. The patient states has been having cough and shortness of breath for the last 2 days. The cough is with productive sputum, whitish sputum. Has been experiencing subjective fevers. However, in the last 3 days the shortness of breath was significantly worse. Concerning this, EMS was called. Workup in the Emergency Department with a chest x-ray showed increased size of the spiculated  mass in the right upper lobe consistent with carcinoma of the lung. The patient received vancomycin and Zosyn in the Emergency Department, considering the patient's recent admission.   PAST MEDICAL HISTORY:  1.  COPD.   2.  Cirrhosis of the liver.  3.  Alcohol abuse. 4.  Depression.   PAST SURGICAL HISTORY: Neck surgery.  ALLERGIES: No known drug allergies.   HOME MEDICATIONS:  1.  Thiamine 100 mg once a day.  2.  Theophylline 300 mg once a day.  3.  Symbicort 2 puffs 2 times a day.  4.  Spironolactone 25 mg once a day.  5.  Spiriva inhaled once a day.   6.  Proventil 2 inhalations 2 times a day.  7.  Propanol 5 mg every 12 hours.  8.  Omeprazole 20 mg 2 times a day.  9.  Naprosyn 500 mg 2 times a day.  10.  Multivitamin once a day.  11.  Magnesium oxide 400 mg 2 times a day.  12.  Lasix 10 mg once a day.  13.  Folic acid 1 tablet daily.  14.  Ferrous sulfate 325 mg p.o. 2 times a day.  15.  Citalopram 20 mg once a day.    SOCIAL HISTORY: Continues to smoke about 1/2 pack a day. Drank alcohol in the past heavily. Denies using any illicit drugs currently. Lives with Chrissie NoaStephanie Sprankle, who is the patient's medical power of attorney who can be reached at [phone].   FAMILY HISTORY: Hypertension.   REVIEW OF SYSTEMS:  CONSTITUTIONAL: Experiencing generalized weakness.  EYES: No change in vision. EAR, NOSE, THROAT: No change in hearing. The patient is very hard of hearing even with hearing aides.   CHEST: Has no focal tenderness.  RESPIRATORY: Has cough, shortness of breath.  CARDIOVASCULAR: No chest pain, palpations. GASTROINTESTINAL: Has decreased appetite and nausea.  GENITOURINARY: No dysuria or hematuria.  HEMATOLOGIC: No easy bruising or bleeding.  SKIN: No rash or lesions.  MUSCULOSKELETAL: No joint pains and aches.  NEUROLOGIC: No weakness or numbness in any part of the body.   PHYSICAL EXAMINATION:  GENERAL: This is a disheveled male, chronically ill-looking, looks much older than his stated age, lying down in the bed, not in distress.  VITAL SIGNS: Temperature 97.8, pulse 87, blood pressure 110/77, respiratory rate of 16, oxygen saturation 98% on 2 L of oxygen.  HEENT: Head normocephalic, atraumatic. There is no scleral icterus. Conjunctivae normal. Pupils equal and reactive. Mucous membranes mild dryness.  NECK: Supple. No lymphadenopathy. No JVD. No carotid bruit.  No thyromegaly.  CHEST: Has no focal tenderness.  RESPIRATORY: Bilateral diffuse wheezing.  HEART: S1, S2 regular. No murmurs are heard.  ABDOMEN: Bowel sounds soft, nontender, nondistended.  EXTREMITIES: No pedal edema. Pulses 2+.  NEUROLOGIC: The patient is alert, oriented to place, person, and time. Cranial nerves II-XII intact. Motor 5/5 in upper and lower extremities.   LABORATORY AND RADIOLOGIC DATA:  Chest x-ray, one view, portable: Increased size of spiculated mass, right upper lobe, consistent with carcinoma  .  BMP:  Sodium 132; the rest of all the values are within normal limits.  CBC: WBC of 9.5, hemoglobin 15, platelet count of 108,000.   ASSESSMENT AND PLAN:  1.  Chronic obstructive pulmonary disease exacerbation: Continue with the breathing treatments, Solu-Medrol.  2.  Possible pneumonia: Keep the patient on vancomycin and Zosyn. Obtain sputum cultures. De-escalate the antibiotics once culture data is available.  3.  Spiculated mass in the right upper lobe: The patient refused any workup.  4.  Cirrhosis: Continue with propranolol, spironolactone.   5.  Severe debility: Continue with the physical therapy.  6.  Hyponatremia: Secondary to lung process. Seems to be dehydrated. Continue with intravenous fluids.  7.  Keep the patient on deep vein thrombosis prophylaxis with Lovenox.   TIME SPENT: 60 minutes.    ____________________________ Susa Griffins, MD pv:bm D: 03/18/2014 00:22:51 ET T: 03/18/2014 01:11:34 ET JOB#: [id number]  cc: Susa Griffins, MD, <Dictator> Susa Griffins MD ELECTRONICALLY SIGNED 03/28/2014 23:44

## 2014-07-15 NOTE — Discharge Summary (Signed)
PATIENT NAME:  Gary Cortez, Gary Cortez MR#:  [id number] DATE OF BIRTH:  1945/09/30  DATE OF ADMISSION:  03/18/2014 DATE OF DISCHARGE:  03/19/2014  ADMITTING PHYSICIAN:  Susa GriffinsPadmaja Vasireddy, MD.    DISCHARGING PHYSICIAN:  Enid Baasadhika Fajr Fife, MD.    [facility] [facility]    CONSULTATIONS IN THE HOSPITAL: Palliative care consultation by Ned GraceNancy Phifer, MD.  DISCHARGE DIAGNOSES:  1. Acute on chronic obstructive pulmonary disease exacerbation with acute bronchitis.  2. Worsening right lung mass, likely malignant.  The patient refused further work-up.  3. Significant tobacco abuse disorder.  4. Alcohol abuse.  5. Pancytopenia.  6. Status post partial tongue resection for oral cancer.   DISCHARGE MEDICATIONS:  1. Omeprazole 20 mg p.o. b.i.d.  2. Folic acid 1 mg p.o. daily.  3. Ferrous sulfate 325 mg p.o. b.i.d.  4. Aldactone 25 mg p.o. daily.  5. Thiamine 100 mg p.o. daily.  6. Lasix 10 mg p.o. daily.  7. Proventil inhaler q. 6 hours p.r.n. for wheezing.  8. Spiriva HandiHaler daily.  9. Celexa 20 mg p.o. daily.  10. Magnesium oxide 400 mg p.o. b.i.d.  11. Propranolol 5 mg p.o. b.i.d.  12. Multivitamin with minerals 1 tablet p.o. daily.  13. Naproxen 500 mg p.o. b.i.d. p.r.n. for pain.  14. Symbicort 160/4.5 mcg 2 puffs inhaled twice a day.   15. Theophylline 300 mg p.o. daily.  16. Prednisone taper over 12 days.  17. Levaquin 500 mg p.o. q. 24 hours for 6 days.  18. DuoNebs 3 mL q. 6 hours p.r.n. for shortness of breath, wheezing.  19. Tessalon Perles 200 mg p.o. 3 times a day for 7 days.  20. Flomax 0.4 mg p.o. daily.  21. Roxanol 20 mg/mL oral concentrate, 0.25 mL q. 6 hours p.r.n. for pain or dyspnea.  22. Ativan 1 mg q. 6 hours p.r.n. for anxiety.  23. Tussionex 5 mL twice a day for 10 days.    DISCHARGE OXYGEN: 2 liters.   DISCHARGE DIET: Low-sodium diet. Discharge diet consistency, mechanical soft with thin liquids, aspiration precautions.  Advised to eat in only small  bites watching respiratory status and with aspiration precautions.    DISCHARGE ACTIVITY: As tolerated. Follow-up.   FOLLOWUP INSTRUCTIONS:  1. Hospice to be following at home.  2. PCP follow-up in 1 to 2 weeks.   LABORATORIES AND IMAGING STUDIES PRIOR TO DISCHARGE: WBC 8.0, hemoglobin 12.1, hematocrit 34.7, platelet count 74,000. Sodium 135, potassium 4.4, chloride 104, bicarbonate 28, BUN 19, creatinine 0.7, glucose 146 and calcium of 8.2. Chest x-ray on admission showing emphysema, increased size of right upper lobe spiculated mass consistent with carcinoma of the lung. ALT 22, AST 33, alkaline phosphatase 60, albumin 4.0, total bilirubin 2.0.   BRIEF HOSPITAL COURSE: Mr. Gary Cortez is a 69 year old Caucasian male with past medical history significant for significant alcohol abuse, smoking about 2 to 3 packs every day, known history of COPD, right lung mass that is increasing in size, who presents to the hospital secondary to difficulty breathing.  1. Acute on chronic COPD exacerbation with chronic bronchitis. The patient continues to smoke in spite of counseling and has a known right lung mass, which is increasing in size.  [facility] and it is likely malignant.  He refused workup.  The same was told in the Emergency Room and discussed and I personally discussed with the patient who wanted to not pursue any further workup and states when it is time  for him to go, he will not resist it.  He was treated with steroids, nebulizers, inhalers, antibiotics and is being discharged home in relatively stable condition. He is being discharged on home oxygen and due to his more underlying chronic medical problems and his worsening lung cancer, he is considered to be hospice appropriate patient. Discussed with Judeth Cornfield, his friend and caretaker, who agreed for hospice and the patient is being discharged with hospice.  He will get Roxanol and Ativan as needed with hospice. Nebulizers,  inhalers are also being continued at this point.  2. Strongly counseled against smoking and alcohol abuse. All his other home medications are being continued without any changes.   DISCHARGE CONDITION: Guarded with poor long-term prognosis.   DISCHARGE DISPOSITION: Home with hospice.    TIME SPENT ON DISCHARGE: 45 minutes.    ____________________________ Enid Baas, MD rk:by D: 03/19/2014 15:02:42 ET T: 03/19/2014 21:16:07 ET JOB#: [id number]  cc: Enid Baas, MD, <Dictator> Enid Baas MD ELECTRONICALLY SIGNED 03/24/2014 16:50

## 2015-02-18 ENCOUNTER — Ambulatory Visit: Admit: 2015-02-18 | Payer: Self-pay | Admitting: Nurse Practitioner

## 2015-06-05 IMAGING — CT CT ANGIO CHEST
2 of 7 series · 13 of 36 positions shown · IV contrast (APPLIED)
Comparison: Portable chest radiograph 8442 hr the same day and
earlier.

CLINICAL DATA: 68-year-old male with shortness of breath. Recent
"Mouth and throat" surgery at [REDACTED] in [place]. Initial encounter.
"Known lung lesion" .

EXAM:
CT ANGIOGRAPHY CHEST WITH CONTRAST
TECHNIQUE: Multidetector CT imaging of the chest was performed using the
standard protocol during bolus administration of intravenous
contrast. Multiplanar CT image reconstructions and MIPs were
obtained to evaluate the vascular anatomy.
CONTRAST:  100 mL Isovue 370.

[Series 6: pe 1.0 thins · axial · 0.73mm/px · z∈[-1398,-1142]mm · 12 of 304 slices shown]
[im 24/304  lung]
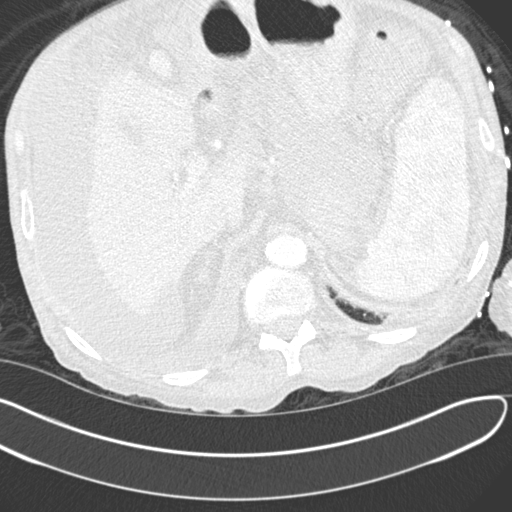
[im 47/304  mediastinal]
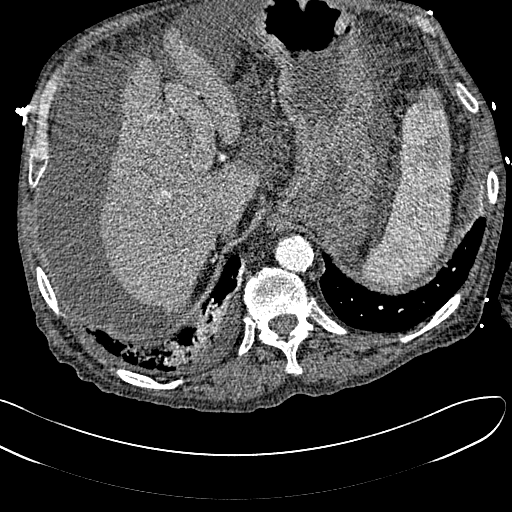
[im 70/304  lung]
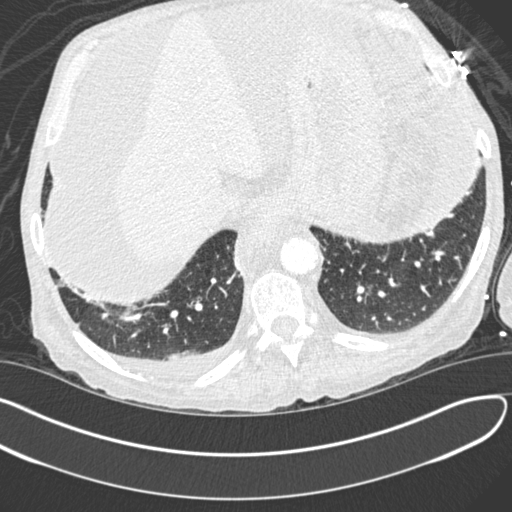
[im 94/304  mediastinal]
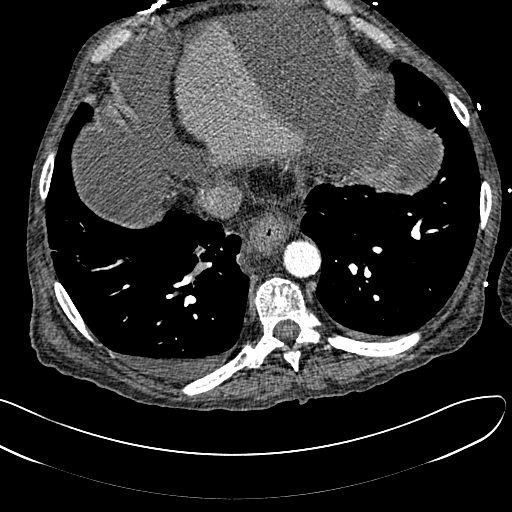
[im 117/304  lung]
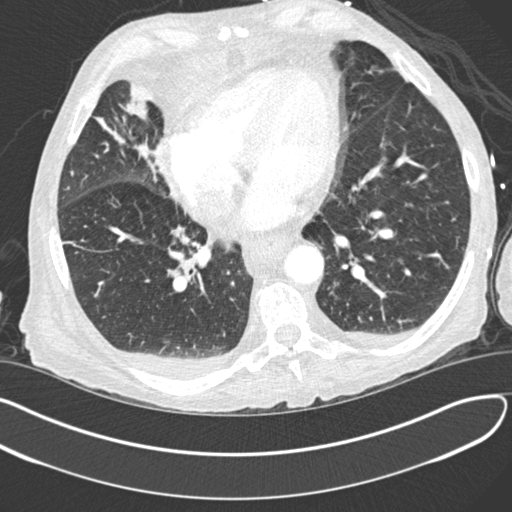
[im 140/304  mediastinal]
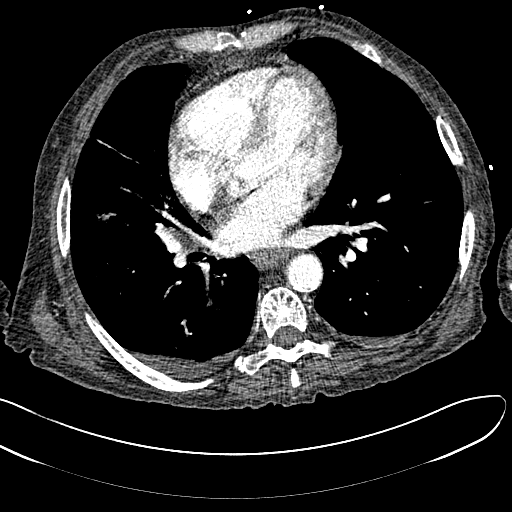
[im 164/304  lung]
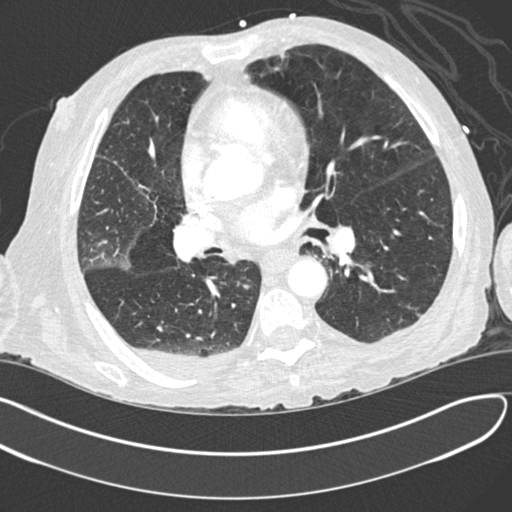
[im 187/304  mediastinal]
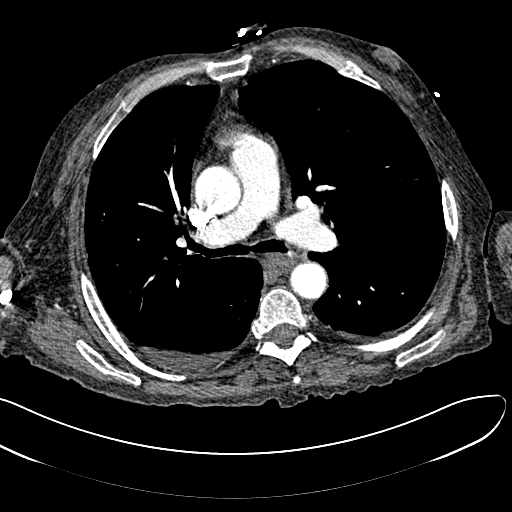
[im 210/304  lung]
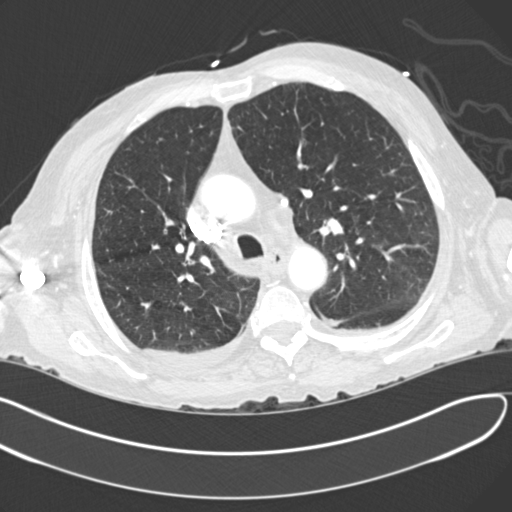
[im 234/304  mediastinal]
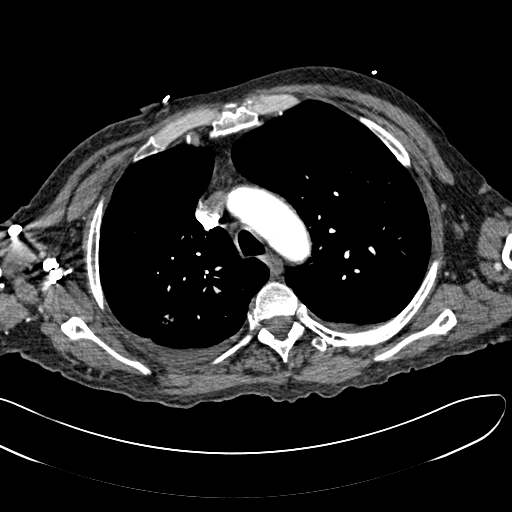
[im 257/304  lung]
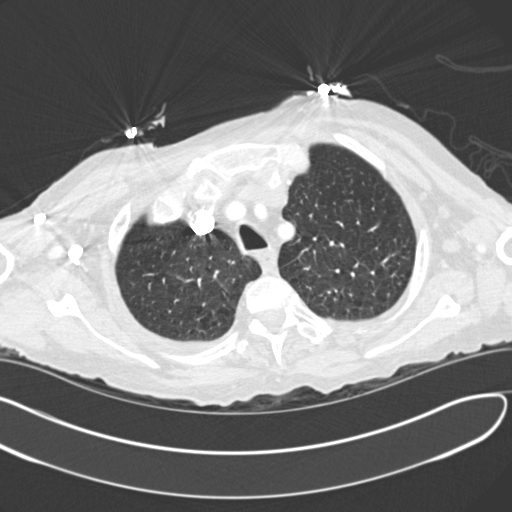
[im 280/304  mediastinal]
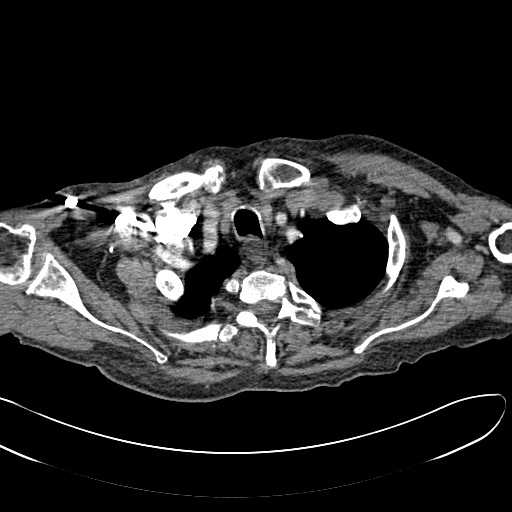

[Series 9: cor pe 2.0 mpr · coronal · 0.61mm/px · 1 of 160 slices shown]
[im 80/160  mediastinal]
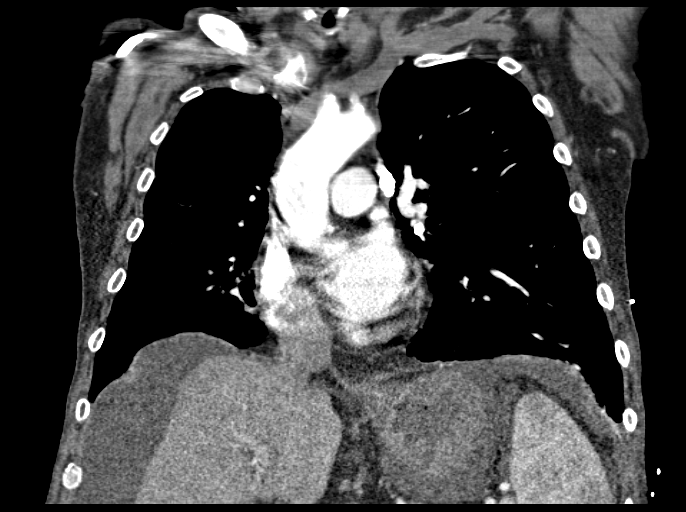

[13 of 36 positions shown; findings below may reference images not displayed]

FINDINGS: Adequate contrast bolus timing in the pulmonary arterial tree.

No focal filling defect identified in the pulmonary arterial tree to
suggest the presence of acute pulmonary embolism.

Small bilateral layering pleural effusions. No pericardial effusion.
Negative visualized aorta except for atherosclerosis.

Major airways are patent. There is a combination of right middle
lobe peribronchovascular opacity collapse and irregular, some of
which is nodular (e.g. coronal image 78, medial segment axial images
90 and 95). There is sub solid peribronchovascular nodularity in the
posterior right upper lobe (image 55). Nearby there is a solid
spiculated 15 mm nodule in the right upper lobe (image 38, also on
coronal image 117).

Only mild compressive atelectasis in the left lung.

No hilar or mediastinal lymphadenopathy.

Mild indistinctness of the visible caudal aspect of the left
sternocleidomastoid muscle on image 1.

Small, cirrhotic liver with moderate volume upper abdominal ascites,
splenomegaly, distal esophageal varices, and gastrohepatic ligament
varices.

No acute or suspicious osseous lesion identified.

Review of the MIP images confirms the above findings.
IMPRESSION: 1.  No evidence of acute pulmonary embolus.
2. Multifocal peribronchovascular nodular and subsolid opacity in
the right middle lobe and right upper lobe. Some of these appear
infectious (consider early pneumonia), but some of the lung nodules
are indeterminate for malignancy (especially the 15 mm nodule in the
upper lobe on series 7, image 38). No mediastinal lymphadenopathy.
These lung nodules could be compared with any previous [facility]
imaging would be valuable.
3. Small bilateral layering pleural effusions.
4. Cirrhosis and stigmata of portal venous hypertension including
moderate ascites, and varices.

## 2015-06-06 IMAGING — CR DG ABDOMEN 1V
1 series · 3 of 3 positions shown · non-contrast
Comparison: Chest x-ray and CT scan of the chest performed
yesterday

CLINICAL DATA: Vomiting

EXAM:
ABDOMEN - 1 VIEW

[Series 1: supine kub · 0.17mm/px · 3 of 3 slices shown]
[im 1/3]
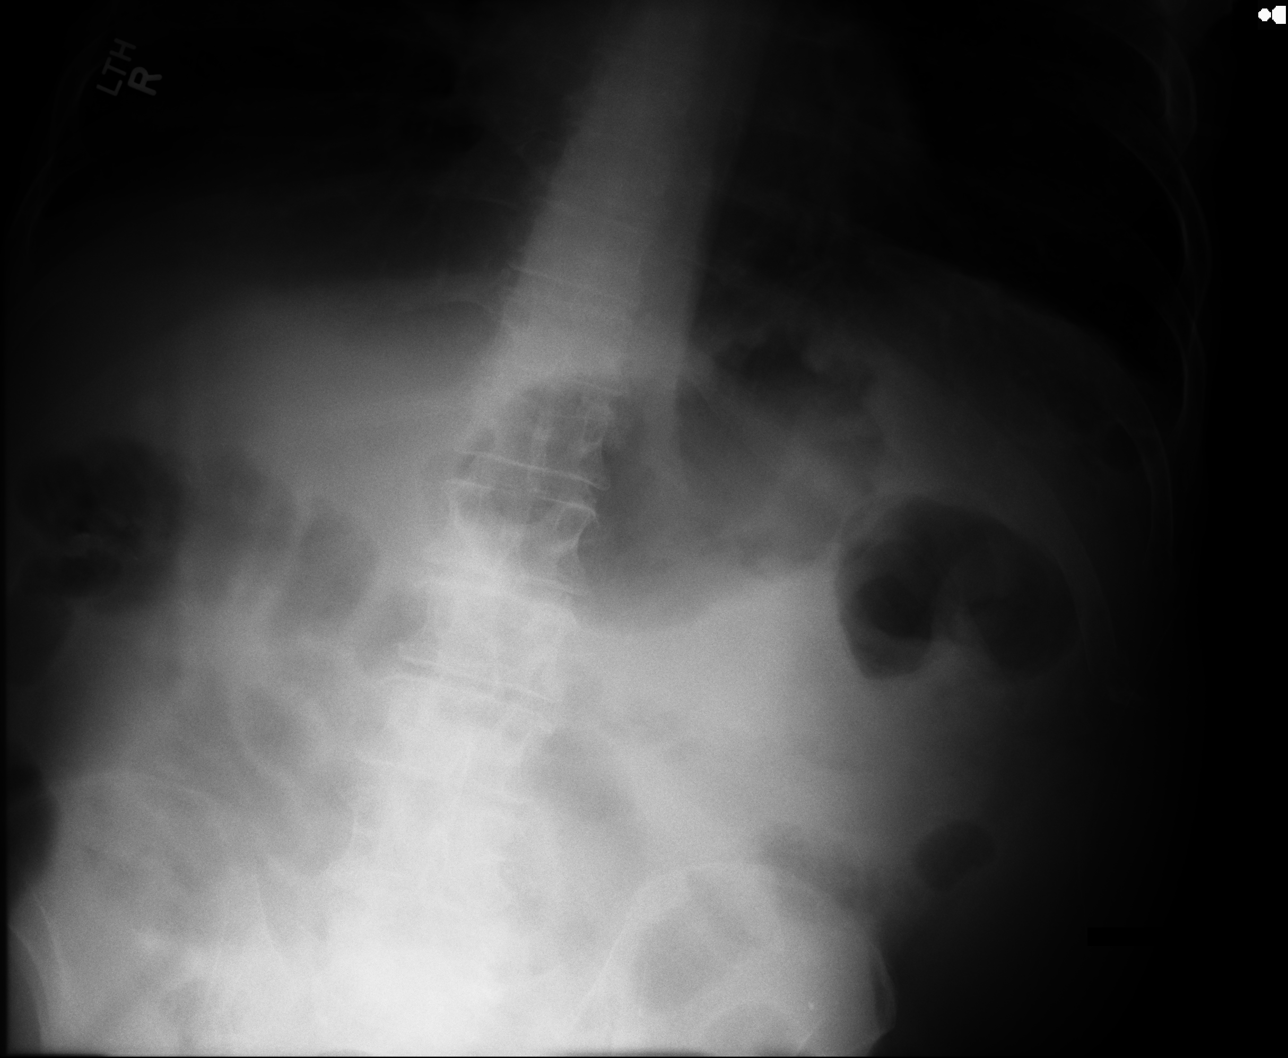
[im 2/3]
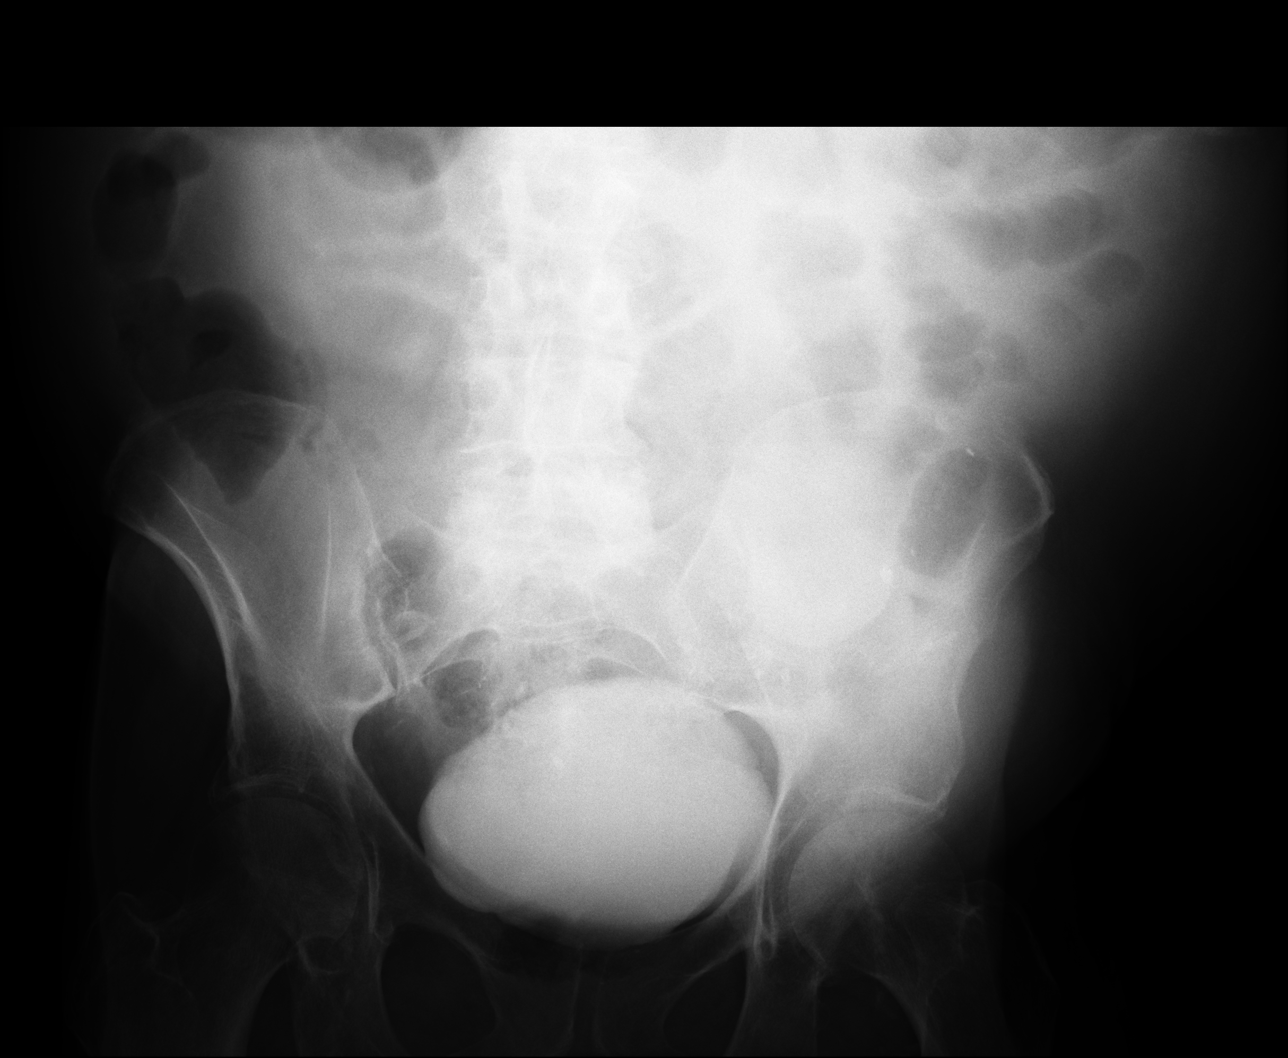
[im 3/3]
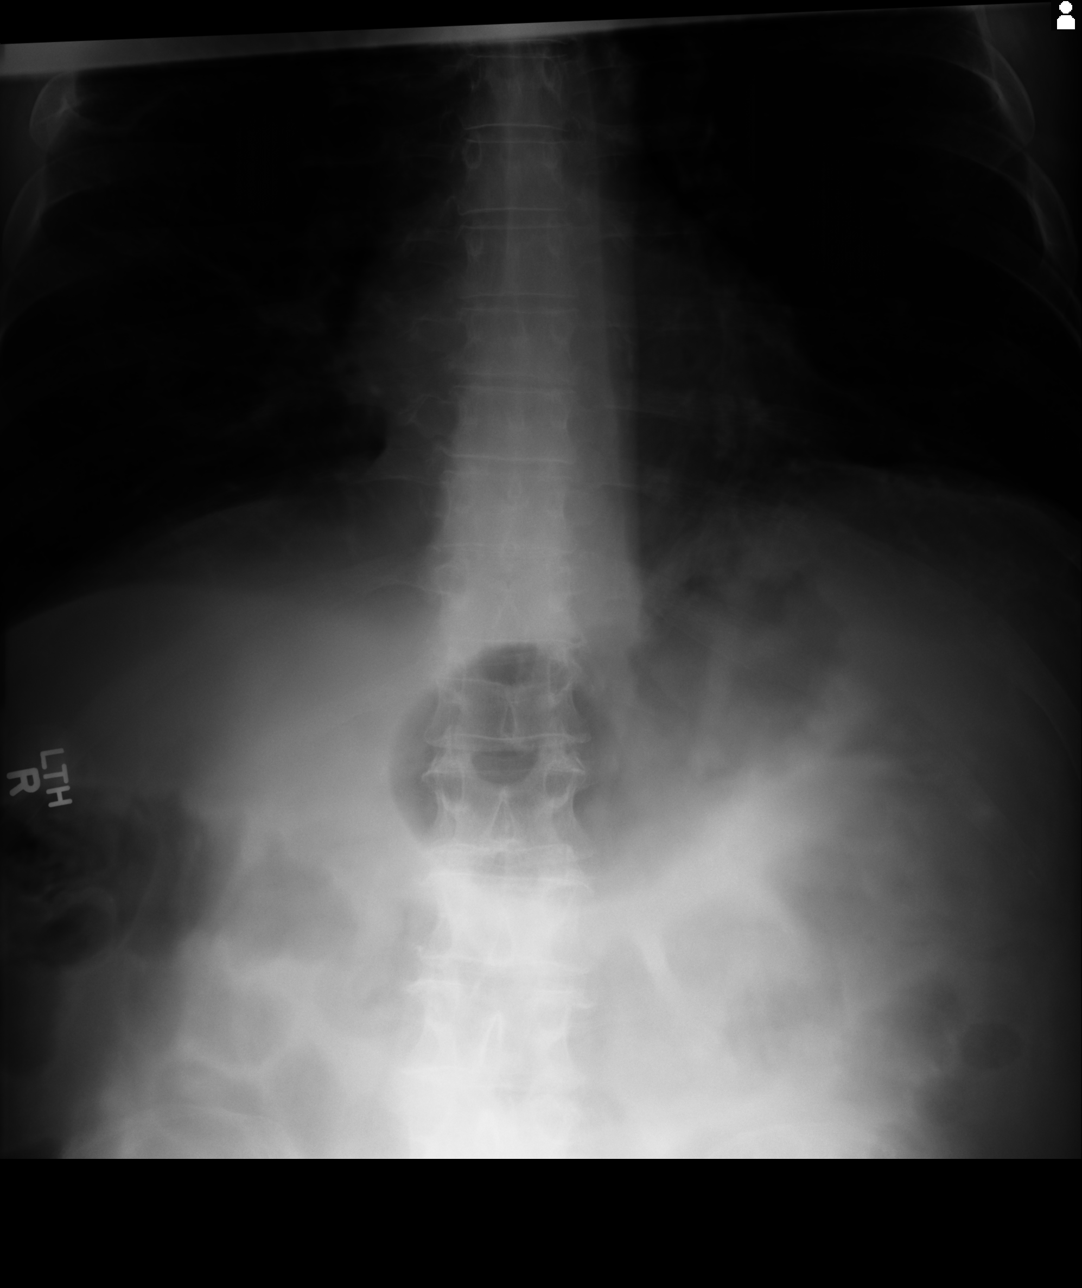

[3 of 3 positions shown; findings below may reference images not displayed]

FINDINGS: Limited evaluation secondary to underpenetration. Clustering of
loops of small bowel in the mid abdomen with loss of the
properitoneal fat line consistent with ascites. No evidence of bowel
obstruction. No dilated loops of bowel. However, there is gas
throughout both large and small bowel. The lung bases are clear.
Excreted contrast material fills the bladder.
IMPRESSION: No evidence of bowel obstruction.

Nonspecific gas within nondilated small bowel. Similar findings can
be seen in the setting of gastroenteritis.

Ascites.
